# Patient Record
Sex: Male | Born: 1980
Health system: Southern US, Community
[De-identification: ages and names within clinical notes are randomized; demographics above are authoritative.]

## PROBLEM LIST (undated history)

## (undated) DIAGNOSIS — T7840XA Allergy, unspecified, initial encounter: Secondary | ICD-10-CM

## (undated) DIAGNOSIS — K219 Gastro-esophageal reflux disease without esophagitis: Secondary | ICD-10-CM

## (undated) HISTORY — DX: Gastro-esophageal reflux disease without esophagitis: K21.9

## (undated) HISTORY — PX: KNEE SURGERY: SHX244

## (undated) HISTORY — PX: TYMPANOSTOMY TUBE PLACEMENT: SHX32

## (undated) HISTORY — PX: TONSILLECTOMY: SUR1361

## (undated) HISTORY — DX: Allergy, unspecified, initial encounter: T78.40XA

---

## 2005-01-13 ENCOUNTER — Ambulatory Visit: Payer: Self-pay | Admitting: Family Medicine

## 2005-05-01 ENCOUNTER — Ambulatory Visit: Payer: Self-pay | Admitting: Family Medicine

## 2005-06-23 ENCOUNTER — Ambulatory Visit: Payer: Self-pay | Admitting: Family Medicine

## 2006-03-22 ENCOUNTER — Ambulatory Visit: Payer: Self-pay | Admitting: Family Medicine

## 2006-12-14 ENCOUNTER — Ambulatory Visit: Payer: Self-pay | Admitting: Family Medicine

## 2006-12-14 DIAGNOSIS — J309 Allergic rhinitis, unspecified: Secondary | ICD-10-CM | POA: Insufficient documentation

## 2006-12-14 DIAGNOSIS — S81809A Unspecified open wound, unspecified lower leg, initial encounter: Secondary | ICD-10-CM | POA: Insufficient documentation

## 2006-12-31 ENCOUNTER — Ambulatory Visit: Payer: Self-pay | Admitting: Family Medicine

## 2007-01-03 DIAGNOSIS — S8990XA Unspecified injury of unspecified lower leg, initial encounter: Secondary | ICD-10-CM | POA: Insufficient documentation

## 2007-01-03 DIAGNOSIS — S99929A Unspecified injury of unspecified foot, initial encounter: Secondary | ICD-10-CM

## 2007-01-03 DIAGNOSIS — S99919A Unspecified injury of unspecified ankle, initial encounter: Secondary | ICD-10-CM

## 2007-01-05 ENCOUNTER — Encounter: Payer: Self-pay | Admitting: Family Medicine

## 2008-04-11 ENCOUNTER — Ambulatory Visit: Payer: Self-pay | Admitting: Family Medicine

## 2008-04-11 DIAGNOSIS — J019 Acute sinusitis, unspecified: Secondary | ICD-10-CM | POA: Insufficient documentation

## 2008-04-16 ENCOUNTER — Telehealth: Payer: Self-pay | Admitting: Family Medicine

## 2008-06-04 ENCOUNTER — Ambulatory Visit: Payer: Self-pay | Admitting: Family Medicine

## 2008-06-04 DIAGNOSIS — L0211 Cutaneous abscess of neck: Secondary | ICD-10-CM | POA: Insufficient documentation

## 2008-06-04 DIAGNOSIS — L03221 Cellulitis of neck: Secondary | ICD-10-CM

## 2008-06-06 ENCOUNTER — Ambulatory Visit: Payer: Self-pay | Admitting: Family Medicine

## 2008-06-06 LAB — CONVERTED CEMR LAB
Bilirubin Urine: NEGATIVE
Glucose, Urine, Semiquant: NEGATIVE
Ketones, urine, test strip: NEGATIVE
Specific Gravity, Urine: 1.01
pH: 6

## 2008-06-08 LAB — CONVERTED CEMR LAB
ALT: 44 units/L (ref 0–53)
AST: 41 units/L — ABNORMAL HIGH (ref 0–37)
Albumin: 4.4 g/dL (ref 3.5–5.2)
Alkaline Phosphatase: 79 units/L (ref 39–117)
Basophils Absolute: 0 10*3/uL (ref 0.0–0.1)
Calcium: 9.3 mg/dL (ref 8.4–10.5)
Eosinophils Relative: 2.9 % (ref 0.0–5.0)
GFR calc non Af Amer: 143.56 mL/min (ref >60)
Glucose, Bld: 75 mg/dL (ref 70–99)
HCT: 47.1 % (ref 39.0–52.0)
Hemoglobin: 16.3 g/dL (ref 13.0–17.0)
Lymphocytes Relative: 24.9 % (ref 12.0–46.0)
Lymphs Abs: 2.4 10*3/uL (ref 0.7–4.0)
Monocytes Relative: 7.2 % (ref 3.0–12.0)
Neutro Abs: 6.3 10*3/uL (ref 1.4–7.7)
Potassium: 3.8 meq/L (ref 3.5–5.1)
RDW: 12.3 % (ref 11.5–14.6)
Sodium: 142 meq/L (ref 135–145)
Total Bilirubin: 1.1 mg/dL (ref 0.3–1.2)
WBC: 9.7 10*3/uL (ref 4.5–10.5)

## 2008-11-13 ENCOUNTER — Telehealth: Payer: Self-pay | Admitting: Family Medicine

## 2008-11-13 ENCOUNTER — Encounter: Payer: Self-pay | Admitting: Family Medicine

## 2009-05-03 ENCOUNTER — Ambulatory Visit: Payer: Self-pay | Admitting: Family Medicine

## 2009-05-03 DIAGNOSIS — F17200 Nicotine dependence, unspecified, uncomplicated: Secondary | ICD-10-CM | POA: Insufficient documentation

## 2009-07-15 ENCOUNTER — Telehealth: Payer: Self-pay | Admitting: Family Medicine

## 2010-01-17 ENCOUNTER — Encounter: Admission: RE | Admit: 2010-01-17 | Discharge: 2010-01-17 | Payer: Self-pay | Admitting: Occupational Medicine

## 2010-04-15 NOTE — Assessment & Plan Note (Signed)
Summary: sore throat/pain behind ear/chest congestion/cjr   Vital Signs:  Patient profile:   30 year old male Height:      75 inches Weight:      285 pounds BMI:     35.75 Temp:     98.1 degrees F oral BP sitting:   132 / 84  (left arm) Cuff size:   large  Vitals Entered By: Alfred Levins, CMA (May 03, 2009 4:09 PM) CC: neck pain goes to head (sharp pain) x 2 days   History of Present Illness: Here for 3 days of a ST and of fullness and pain in the right neck area. Some dry coughing. No fever. No HA. Using Motrin. Also, he asks for help to quit smoking.   Allergies (verified): No Known Drug Allergies  Past History:  Past Medical History: Reviewed history from 12/14/2006 and no changes required. Allergic rhinitis  Review of Systems  The patient denies anorexia, fever, weight loss, weight gain, vision loss, decreased hearing, hoarseness, chest pain, syncope, dyspnea on exertion, peripheral edema, headaches, hemoptysis, abdominal pain, melena, hematochezia, severe indigestion/heartburn, hematuria, incontinence, genital sores, muscle weakness, suspicious skin lesions, transient blindness, difficulty walking, depression, unusual weight change, abnormal bleeding, enlarged lymph nodes, angioedema, breast masses, and testicular masses.    Physical Exam  General:  Well-developed,well-nourished,in no acute distress; alert,appropriate and cooperative throughout examination Head:  Normocephalic and atraumatic without obvious abnormalities. No apparent alopecia or balding. Eyes:  No corneal or conjunctival inflammation noted. EOMI. Perrla. Funduscopic exam benign, without hemorrhages, exudates or papilledema. Vision grossly normal. Ears:  External ear exam shows no significant lesions or deformities.  Otoscopic examination reveals clear canals, tympanic membranes are intact bilaterally without bulging, retraction, inflammation or discharge. Hearing is grossly normal bilaterally. Nose:   External nasal examination shows no deformity or inflammation. Nasal mucosa are pink and moist without lesions or exudates. Mouth:  Oral mucosa and oropharynx without lesions or exudates.  Teeth in good repair. Neck:  No deformities or masses. Mildly tender along the right sternoclaidomastoid chain Lungs:  Normal respiratory effort, chest expands symmetrically. Lungs are clear to auscultation, no crackles or wheezes. Skin:  Intact without suspicious lesions or rashes Cervical Nodes:  No lymphadenopathy noted   Impression & Recommendations:  Problem # 1:  CELLULITIS AND ABSCESS OF NECK (ICD-682.1)  His updated medication list for this problem includes:    Augmentin 875-125 Mg Tabs (Amoxicillin-pot clavulanate) .Marland Kitchen..Marland Kitchen Two times a day  Problem # 2:  TOBACCO ABUSE (ICD-305.1)  His updated medication list for this problem includes:    Chantix Starting Month Pak 0.5 Mg X 11 & 1 Mg X 42 Tabs (Varenicline tartrate) .Marland Kitchen... As directed    Chantix Continuing Month Pak 1 Mg Tabs (Varenicline tartrate) .Marland Kitchen... As directed  Complete Medication List: 1)  Augmentin 875-125 Mg Tabs (Amoxicillin-pot clavulanate) .... Two times a day 2)  Chantix Starting Month Pak 0.5 Mg X 11 & 1 Mg X 42 Tabs (Varenicline tartrate) .... As directed 3)  Chantix Continuing Month Pak 1 Mg Tabs (Varenicline tartrate) .... As directed  Patient Instructions: 1)  Get on Augmentin and follow up as needed. Use Motrin as needed , Try Chantix Prescriptions: CHANTIX CONTINUING MONTH PAK 1 MG TABS (VARENICLINE TARTRATE) as directed  #1 x 1   Entered and Authorized by:   Nelwyn Salisbury MD   Signed by:   Nelwyn Salisbury MD on 05/03/2009   Method used:   Print then Give to Patient   RxID:  [id number] CHANTIX STARTING MONTH PAK 0.5 MG X 11 & 1 MG X 42 TABS (VARENICLINE TARTRATE) as directed  #1 x 0   Entered and Authorized by:   Nelwyn Salisbury MD   Signed by:   Nelwyn Salisbury MD on 05/03/2009   Method used:   Print then Give to  Patient   RxID:   [id number] AUGMENTIN 875-125 MG TABS (AMOXICILLIN-POT CLAVULANATE) two times a day  #20 x 0   Entered and Authorized by:   Nelwyn Salisbury MD   Signed by:   Nelwyn Salisbury MD on 05/03/2009   Method used:   Electronically to        [facility]  [address] [id number] ([facility]       [address]       [place], [place]  [address]       Ph: [phone] or [phone]       Fax: [phone]   RxID:   [id number]

## 2010-04-15 NOTE — Progress Notes (Signed)
Summary: pt lost written script for Chantix. Pls call in to [facility]   Phone Note Call from Patient Call back at [phone] cell   Caller: Patient Summary of Call: Pt lost written script for Chantix. Please call in to [facility] on [address]   Initial call taken by: Lucy Antigua,  Jul 15, 2009 11:02 AM  Follow-up for Phone Call        Rx Called In Follow-up by: Raechel Ache, RN,  Jul 15, 2009 11:05 AM    Prescriptions: CHANTIX CONTINUING MONTH PAK 1 MG TABS (VARENICLINE TARTRATE) as directed  #1 x 1   Entered by:   Raechel Ache, RN   Authorized by:   Nelwyn Salisbury MD   Signed by:   Raechel Ache, RN on 07/15/2009   Method used:   [facility]ically to        [facility]  [address] [id number]       [address]       [place], [place]  [address]       Ph: [phone] or [phone]       Fax: [phone]   RxID:   [id number] CHANTIX STARTING MONTH PAK 0.5 MG X 11 & 1 MG X 42 TABS (VARENICLINE TARTRATE) as directed  #1 x 0   Entered by:   Raechel Ache, RN   Authorized by:   Nelwyn Salisbury MD   Signed by:   Raechel Ache, RN on 07/15/2009   Method used:   [facility]        [facility]  [address] [id number]       [address]       [place], [place]  [address]       Ph: [phone] or [phone]       Fax: [phone]   RxID:   [id number]

## 2010-06-25 ENCOUNTER — Encounter: Payer: Self-pay | Admitting: Family Medicine

## 2010-06-30 ENCOUNTER — Other Ambulatory Visit: Payer: Self-pay

## 2010-07-07 ENCOUNTER — Encounter: Payer: Self-pay | Admitting: Family Medicine

## 2010-07-07 DIAGNOSIS — Z0289 Encounter for other administrative examinations: Secondary | ICD-10-CM

## 2010-11-07 ENCOUNTER — Ambulatory Visit (INDEPENDENT_AMBULATORY_CARE_PROVIDER_SITE_OTHER): Payer: 59 | Admitting: Family Medicine

## 2010-11-07 ENCOUNTER — Encounter: Payer: Self-pay | Admitting: Family Medicine

## 2010-11-07 VITALS — BP 140/90 | HR 90 | Temp 98.4°F | Wt 308.0 lb

## 2010-11-07 DIAGNOSIS — R635 Abnormal weight gain: Secondary | ICD-10-CM

## 2010-11-07 DIAGNOSIS — J329 Chronic sinusitis, unspecified: Secondary | ICD-10-CM

## 2010-11-07 LAB — CBC WITH DIFFERENTIAL/PLATELET
Eosinophils Relative: 3 % (ref 0.0–5.0)
Lymphocytes Relative: 17.8 % (ref 12.0–46.0)
Monocytes Absolute: 1.4 10*3/uL — ABNORMAL HIGH (ref 0.1–1.0)
Monocytes Relative: 12.8 % — ABNORMAL HIGH (ref 3.0–12.0)
Neutrophils Relative %: 66 % (ref 43.0–77.0)
Platelets: 204 10*3/uL (ref 150.0–400.0)
RBC: 5.58 Mil/uL (ref 4.22–5.81)
WBC: 10.6 10*3/uL — ABNORMAL HIGH (ref 4.5–10.5)

## 2010-11-07 LAB — HEPATIC FUNCTION PANEL
ALT: 58 U/L — ABNORMAL HIGH (ref 0–53)
Total Protein: 7.5 g/dL (ref 6.0–8.3)

## 2010-11-07 LAB — BASIC METABOLIC PANEL
BUN: 16 mg/dL (ref 6–23)
CO2: 29 mEq/L (ref 19–32)
Calcium: 9.1 mg/dL (ref 8.4–10.5)
Chloride: 102 mEq/L (ref 96–112)
Creatinine, Ser: 1 mg/dL (ref 0.4–1.5)

## 2010-11-07 LAB — TSH: TSH: 0.44 u[IU]/mL (ref 0.35–5.50)

## 2010-11-07 MED ORDER — AZITHROMYCIN 250 MG PO TABS: ORAL_TABLET | ORAL | Status: AC

## 2010-11-07 NOTE — Progress Notes (Signed)
  Subjective:    Patient ID: Christopher Fox, male    DOB: 17-Aug-1980, 30 y.o.   MRN: [id number]  HPI Here for 5 days of sinus pressure, PND, ST, and a dry cough. No fever. Also he has put on about 25 lbs of weight in the past year, and he feels tired all the time with no stamina. He admits to eating a poor diet.    Review of Systems  Constitutional: Positive for unexpected weight change.  HENT: Positive for congestion, postnasal drip and sinus pressure.   Eyes: Negative.   Respiratory: Positive for cough.        Objective:   Physical Exam  Constitutional: He appears well-developed and well-nourished.  HENT:  Right Ear: External ear normal.  Left Ear: External ear normal.  Nose: Nose normal.  Mouth/Throat: Oropharynx is clear and moist. No oropharyngeal exudate.  Eyes: Conjunctivae are normal. Pupils are equal, round, and reactive to light.  Neck: No thyromegaly present.  Pulmonary/Chest: Effort normal and breath sounds normal. No respiratory distress. He has no wheezes. He has no rales. He exhibits no tenderness.  Lymphadenopathy:    He has no cervical adenopathy.          Assessment & Plan:  Treat with a Zpack. Get labs to look at the weight gain. We discussed his need to eat a better diet

## 2010-11-12 ENCOUNTER — Telehealth: Payer: Self-pay | Admitting: Family Medicine

## 2010-11-12 NOTE — Telephone Encounter (Signed)
Left voice message with results.

## 2010-11-12 NOTE — Telephone Encounter (Signed)
Message copied by Baldemar Friday on Wed Nov 12, 2010 10:17 AM ------      Message from: Gershon Crane A      Created: Mon Nov 10, 2010 10:29 AM       Normal
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
## 2012-09-20 ENCOUNTER — Ambulatory Visit (INDEPENDENT_AMBULATORY_CARE_PROVIDER_SITE_OTHER): Payer: 59 | Admitting: Family Medicine

## 2012-09-20 ENCOUNTER — Encounter: Payer: Self-pay | Admitting: Family Medicine

## 2012-09-20 VITALS — BP 138/90 | HR 87 | Temp 98.0°F | Wt 304.0 lb

## 2012-09-20 DIAGNOSIS — K219 Gastro-esophageal reflux disease without esophagitis: Secondary | ICD-10-CM

## 2012-09-20 MED ORDER — OMEPRAZOLE 40 MG PO CPDR: 40.0000 mg | DELAYED_RELEASE_CAPSULE | Freq: Every day | ORAL | Status: DC

## 2012-09-20 NOTE — Progress Notes (Signed)
  Subjective:    Patient ID: Christopher Fox, male    DOB: August 16, 1980, 32 y.o.   MRN: [id number]  HPI Here for intermittent heartburn, chest pressure, and upper abdominal pains for the past 2 months. No SOB or nausea. OTC Zantac helps a little. This is not related to exertion.    Review of Systems  Constitutional: Negative.   Respiratory: Positive for chest tightness. Negative for cough, shortness of breath and wheezing.   Cardiovascular: Positive for chest pain. Negative for palpitations and leg swelling.  Gastrointestinal: Positive for abdominal pain. Negative for nausea, vomiting, diarrhea, constipation, blood in stool and abdominal distention.       Objective:   Physical Exam  Constitutional: He appears well-developed and well-nourished. No distress.  Neck: No thyromegaly present.  Cardiovascular: Normal rate, regular rhythm, normal heart sounds and intact distal pulses.   Pulmonary/Chest: Effort normal and breath sounds normal.  Abdominal: Soft. Bowel sounds are normal. He exhibits no distension and no mass. There is no tenderness. There is no rebound and no guarding.  Lymphadenopathy:    He has no cervical adenopathy.          Assessment & Plan:  Try Omeprazole. We will follow up at his cpx next month

## 2012-10-05 ENCOUNTER — Other Ambulatory Visit (INDEPENDENT_AMBULATORY_CARE_PROVIDER_SITE_OTHER): Payer: 59

## 2012-10-05 DIAGNOSIS — Z0000 Encounter for general adult medical examination without abnormal findings: Secondary | ICD-10-CM

## 2012-10-05 LAB — BASIC METABOLIC PANEL
CO2: 26 mEq/L (ref 19–32)
Chloride: 104 mEq/L (ref 96–112)
GFR: 119.42 mL/min (ref >60.00)
Glucose, Bld: 89 mg/dL (ref 70–99)
Potassium: 4.2 mEq/L (ref 3.5–5.1)
Sodium: 140 mEq/L (ref 135–145)

## 2012-10-05 LAB — TSH: TSH: 1.08 u[IU]/mL (ref 0.35–5.50)

## 2012-10-05 LAB — CBC WITH DIFFERENTIAL/PLATELET
Basophils Absolute: 0 10*3/uL (ref 0.0–0.1)
HCT: 46.3 % (ref 39.0–52.0)
Hemoglobin: 15.9 g/dL (ref 13.0–17.0)
Lymphs Abs: 2.6 10*3/uL (ref 0.7–4.0)
MCV: 82.2 fl (ref 78.0–100.0)
Monocytes Relative: 5.8 % (ref 3.0–12.0)
Neutro Abs: 8.7 10*3/uL — ABNORMAL HIGH (ref 1.4–7.7)
RDW: 12.7 % (ref 11.5–14.6)

## 2012-10-05 LAB — LIPID PANEL
LDL Cholesterol: 125 mg/dL — ABNORMAL HIGH (ref 0–99)
VLDL: 22.8 mg/dL (ref 0.0–40.0)

## 2012-10-05 LAB — POCT URINALYSIS DIPSTICK
Ketones, UA: NEGATIVE
Leukocytes, UA: NEGATIVE
Protein, UA: NEGATIVE
Urobilinogen, UA: 0.2

## 2012-10-05 LAB — HEPATIC FUNCTION PANEL: Albumin: 4.4 g/dL (ref 3.5–5.2)

## 2012-10-07 NOTE — Progress Notes (Signed)
Quick Note:  Pt has appointment on 10/19/12 will go over then. ______

## 2012-10-12 ENCOUNTER — Encounter: Payer: 59 | Admitting: Family Medicine

## 2012-10-19 ENCOUNTER — Encounter: Payer: Self-pay | Admitting: Family Medicine

## 2012-10-19 ENCOUNTER — Ambulatory Visit (INDEPENDENT_AMBULATORY_CARE_PROVIDER_SITE_OTHER): Payer: 59 | Admitting: Family Medicine

## 2012-10-19 VITALS — BP 138/80 | HR 106 | Temp 97.9°F | Ht 75.0 in | Wt 301.0 lb

## 2012-10-19 DIAGNOSIS — Z0000 Encounter for general adult medical examination without abnormal findings: Secondary | ICD-10-CM

## 2012-10-19 NOTE — Progress Notes (Signed)
  Subjective:    Patient ID: Christopher Fox, male    DOB: April 12, 1980, 32 y.o.   MRN: [id number]  HPI 32 yr old male for a cpx. He feels fine and has no concerns. He has been on Prilosec for a month, and his GERD has totally resolved.    Review of Systems  Constitutional: Negative.   HENT: Negative.   Eyes: Negative.   Respiratory: Negative.   Cardiovascular: Negative.   Gastrointestinal: Negative.   Genitourinary: Negative.   Musculoskeletal: Negative.   Skin: Negative.   Neurological: Negative.   Psychiatric/Behavioral: Negative.        Objective:   Physical Exam  Constitutional: He is oriented to person, place, and time. He appears well-developed and well-nourished. No distress.  HENT:  Head: Normocephalic and atraumatic.  Right Ear: External ear normal.  Left Ear: External ear normal.  Nose: Nose normal.  Mouth/Throat: Oropharynx is clear and moist. No oropharyngeal exudate.  Eyes: Conjunctivae and EOM are normal. Pupils are equal, round, and reactive to light. Right eye exhibits no discharge. Left eye exhibits no discharge. No scleral icterus.  Neck: Neck supple. No JVD present. No tracheal deviation present. No thyromegaly present.  Cardiovascular: Normal rate, regular rhythm, normal heart sounds and intact distal pulses.  Exam reveals no gallop and no friction rub.   No murmur heard. Pulmonary/Chest: Effort normal and breath sounds normal. No respiratory distress. He has no wheezes. He has no rales. He exhibits no tenderness.  Abdominal: Soft. Bowel sounds are normal. He exhibits no distension and no mass. There is no tenderness. There is no rebound and no guarding.  Genitourinary: Rectum normal, prostate normal and penis normal. Guaiac negative stool. No penile tenderness.  Musculoskeletal: Normal range of motion. He exhibits no edema and no tenderness.  Lymphadenopathy:    He has no cervical adenopathy.  Neurological: He is alert and oriented to person, place, and  time. He has normal reflexes. No cranial nerve deficit. He exhibits normal muscle tone. Coordination normal.  Skin: Skin is warm and dry. No rash noted. He is not diaphoretic. No erythema. No pallor.  Psychiatric: He has a normal mood and affect. His behavior is normal. Judgment and thought content normal.          Assessment & Plan:  Well exam. Encouraged him to lose some weight.

## 2012-12-12 ENCOUNTER — Emergency Department (HOSPITAL_BASED_OUTPATIENT_CLINIC_OR_DEPARTMENT_OTHER)
Admission: EM | Admit: 2012-12-12 | Discharge: 2012-12-12 | Disposition: A | Discharge status: Home / Self Care | Payer: 59 | Attending: Emergency Medicine | Admitting: Emergency Medicine

## 2012-12-12 ENCOUNTER — Encounter (HOSPITAL_BASED_OUTPATIENT_CLINIC_OR_DEPARTMENT_OTHER): Payer: Self-pay | Admitting: *Deleted

## 2012-12-12 ENCOUNTER — Emergency Department (HOSPITAL_BASED_OUTPATIENT_CLINIC_OR_DEPARTMENT_OTHER): Payer: 59

## 2012-12-12 DIAGNOSIS — S022XXA Fracture of nasal bones, initial encounter for closed fracture: Secondary | ICD-10-CM | POA: Insufficient documentation

## 2012-12-12 DIAGNOSIS — IMO0002 Reserved for concepts with insufficient information to code with codable children: Secondary | ICD-10-CM | POA: Insufficient documentation

## 2012-12-12 DIAGNOSIS — K219 Gastro-esophageal reflux disease without esophagitis: Secondary | ICD-10-CM | POA: Insufficient documentation

## 2012-12-12 DIAGNOSIS — S022XXB Fracture of nasal bones, initial encounter for open fracture: Secondary | ICD-10-CM

## 2012-12-12 DIAGNOSIS — Y939 Activity, unspecified: Secondary | ICD-10-CM | POA: Insufficient documentation

## 2012-12-12 DIAGNOSIS — F17200 Nicotine dependence, unspecified, uncomplicated: Secondary | ICD-10-CM | POA: Insufficient documentation

## 2012-12-12 DIAGNOSIS — Z79899 Other long term (current) drug therapy: Secondary | ICD-10-CM | POA: Insufficient documentation

## 2012-12-12 DIAGNOSIS — Y929 Unspecified place or not applicable: Secondary | ICD-10-CM | POA: Insufficient documentation
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
## 2012-12-13 NOTE — ED Provider Notes (Signed)
Medical screening examination/treatment/procedure(s) were performed by non-physician practitioner and as supervising physician I was immediately available for consultation/collaboration.  Elva Breaker T Taitum Alms, MD 12/13/12 1518 

## 2013-02-23 ENCOUNTER — Encounter (HOSPITAL_COMMUNITY): Payer: Self-pay | Admitting: Emergency Medicine

## 2013-02-23 ENCOUNTER — Emergency Department (HOSPITAL_COMMUNITY)
Admission: EM | Admit: 2013-02-23 | Discharge: 2013-02-23 | Disposition: A | Discharge status: Home / Self Care | Payer: 59 | Source: Home / Self Care

## 2013-02-23 DIAGNOSIS — R197 Diarrhea, unspecified: Secondary | ICD-10-CM

## 2013-02-23 DIAGNOSIS — R531 Weakness: Secondary | ICD-10-CM

## 2013-02-23 DIAGNOSIS — Z23 Encounter for immunization: Secondary | ICD-10-CM

## 2013-02-23 DIAGNOSIS — F410 Panic disorder [episodic paroxysmal anxiety] without agoraphobia: Secondary | ICD-10-CM

## 2013-02-23 DIAGNOSIS — R5381 Other malaise: Secondary | ICD-10-CM

## 2013-02-23 MED ORDER — INFLUENZA VAC SPLIT QUAD 0.5 ML IM SUSP
INTRAMUSCULAR | Status: AC
  Filled 2013-02-23: qty 0.5

## 2013-02-23 MED ORDER — INFLUENZA VAC SPLIT QUAD 0.5 ML IM SUSP
0.5000 mL | INTRAMUSCULAR | Status: AC
  Administered 2013-02-23: 0.5 mL via INTRAMUSCULAR

## 2013-02-23 NOTE — ED Notes (Signed)
Patient reports "stomach thing" for the last 24 hours.  Patient reports diarrhea yesterday, no diarrhea today.  Reports several people at work with the same.  Patient reports while working this am, felt lightheaded and dizzy, "like passing out".  Patient volunteers he is under a lot of stress.  Has a 21 month old and 31 year old and concerned of them getting sick.

## 2013-02-23 NOTE — ED Provider Notes (Signed)
CSN: [id number]     Arrival date & time 02/23/13  4098 History   First MD Initiated Contact with Patient 02/23/13 1011     Chief Complaint  Patient presents with  . Dizziness   (Consider location/radiation/quality/duration/timing/severity/associated sxs/prior Treatment) HPI Comments: 32 year old male states that around 9:00 this morning he was at work and had a sudden onset of weakness and dizziness associated with a "wheezy feeling" in his stomach. He actually denied nausea or vomiting. He went back to his truck and sat in the truck and symptoms abated approximately 10-15 minutes after onset. He also notes that he said 4-5 diarrhea stools for the past 24 hours. This is similar to his chronic diarrhea stools but more than usual. Denies chest pain or shortness of breath. Denied syncope. At this time he is asymptomatic and states all symptoms experienced earlier have abated.  Never started the Chantix.   Past Medical History  Diagnosis Date  . Allergy   . GERD (gastroesophageal reflux disease)    Past Surgical History  Procedure Laterality Date  . Tonsillectomy    . Tympanostomy tube placement     Family History  Problem Relation Age of Onset  . Hypertension    . Sudden death    . Stroke    . Asthma     History  Substance Use Topics  . Smoking status: Current Every Day Smoker -- 1.00 packs/day for 10 years    Types: Cigarettes  . Smokeless tobacco: Never Used     Comment: trying to quit  . Alcohol Use: 0.6 oz/week    1 Cans of beer per week     Comment: occ    Review of Systems  Constitutional: Positive for activity change. Negative for fever.  HENT: Negative.   Eyes: Negative for visual disturbance.  Respiratory: Negative for cough, choking and shortness of breath.   Cardiovascular: Negative for chest pain and palpitations.  Gastrointestinal: Positive for diarrhea.  Genitourinary: Negative.   Musculoskeletal: Negative.   Skin: Negative.   Neurological: Positive for  dizziness. Negative for tremors, syncope, speech difficulty and headaches.  Psychiatric/Behavioral: The patient is nervous/anxious.     Allergies  Review of patient's allergies indicates no known allergies.  Home Medications   Current Outpatient Rx  Name  Route  Sig  Dispense  Refill  . omeprazole (PRILOSEC) 40 MG capsule   Oral   Take 1 capsule (40 mg total) by mouth daily.   30 capsule   11    BP 121/70  Pulse 90  Temp(Src) 98.3 F (36.8 C) (Oral)  Resp 16  SpO2 97% Physical Exam  Nursing note and vitals reviewed. Constitutional: He is oriented to person, place, and time. He appears well-developed and well-nourished. No distress.  HENT:  Head: Normocephalic and atraumatic.  Mouth/Throat: Oropharynx is clear and moist. No oropharyngeal exudate.  Eyes: Conjunctivae and EOM are normal.  Neck: Normal range of motion. Neck supple.  Cardiovascular: Normal rate, regular rhythm, normal heart sounds and intact distal pulses.   No carotid bruits  Pulmonary/Chest: Effort normal. No respiratory distress.  Diminished BS bilat, rare expiratory wheeze.   Musculoskeletal: Normal range of motion. He exhibits no edema and no tenderness.  Lymphadenopathy:    He has no cervical adenopathy.  Neurological: He is alert and oriented to person, place, and time. No cranial nerve deficit.  Skin: Skin is warm and dry. No rash noted.  Psychiatric: He has a normal mood and affect.    ED Course  Procedures (including critical care time) Labs Review Labs Reviewed - No data to display Imaging Review No results found. EKG: NSR, No ectopy. No S-T T changes   MDM   1. Anxiety attack   2. Weakness generalized   3. Diarrhea      Nl exam in office. EKG WNL. Diff may included viral syndrome, IBS, anxiety related symptoms. Off work today, rest and drink plenty of fluids Cal your PCP for f/u to discuss poss IBS, and spirometry testing.    Hayden Rasmussen, NP 02/23/13 1144  Hayden Rasmussen,  NP 02/23/13 1145

## 2013-02-23 NOTE — ED Notes (Signed)
Lengthy discussion at triage ... Patient anxious, no paticular reason shared with this nurse

## 2013-02-23 NOTE — ED Provider Notes (Signed)
Medical screening examination/treatment/procedure(s) were performed by non-physician practitioner and as supervising physician I was immediately available for consultation/collaboration.  Leslee Home, M.D.   Reuben Likes, MD 02/23/13 1325

## 2013-10-09 ENCOUNTER — Ambulatory Visit (INDEPENDENT_AMBULATORY_CARE_PROVIDER_SITE_OTHER): Payer: 59 | Admitting: Family Medicine

## 2013-10-09 ENCOUNTER — Encounter: Payer: Self-pay | Admitting: Family Medicine

## 2013-10-09 VITALS — BP 121/66 | HR 66 | Temp 98.5°F | Ht 75.0 in | Wt 250.0 lb

## 2013-10-09 DIAGNOSIS — H6121 Impacted cerumen, right ear: Secondary | ICD-10-CM

## 2013-10-09 DIAGNOSIS — K219 Gastro-esophageal reflux disease without esophagitis: Secondary | ICD-10-CM

## 2013-10-09 DIAGNOSIS — H6120 Impacted cerumen, unspecified ear: Secondary | ICD-10-CM

## 2013-10-09 MED ORDER — OMEPRAZOLE 40 MG PO CPDR: 40.0000 mg | DELAYED_RELEASE_CAPSULE | Freq: Every day | ORAL | Status: DC

## 2013-10-09 NOTE — Progress Notes (Signed)
Pre visit review using our clinic review tool, if applicable. No additional management support is needed unless otherwise documented below in the visit note. 

## 2013-10-09 NOTE — Progress Notes (Signed)
   Subjective:    Patient ID: Caralyn GuileSamuel H Fluegge, male    DOB: 11/02/1980, 33 y.o.   MRN: [id number]  HPI Here for 4 weeks of pressure and decreased hearing in the right ear. No pain or sinus sx. Also his GERD has been well controlled and he needs refills of Nexium.    Review of Systems  Constitutional: Negative.   HENT: Positive for hearing loss. Negative for congestion, ear discharge, ear pain, nosebleeds, postnasal drip and sinus pressure.   Gastrointestinal: Negative.        Objective:   Physical Exam  Constitutional: He appears well-developed and well-nourished.  HENT:  Left Ear: External ear normal.  Nose: Nose normal.  Mouth/Throat: Oropharynx is clear and moist. No oropharyngeal exudate.  Right ear canal is full of cerumen. After this was removed, on re-exam the canal was clear and the TM is clear  Eyes: Conjunctivae are normal.          Assessment & Plan:  We removed all cerumen from the right ear canal. He felt better and could hear normally after that. His GERD is stable

## 2014-05-04 ENCOUNTER — Ambulatory Visit (INDEPENDENT_AMBULATORY_CARE_PROVIDER_SITE_OTHER): Payer: 59 | Admitting: Internal Medicine

## 2014-05-04 ENCOUNTER — Encounter: Payer: Self-pay | Admitting: Internal Medicine

## 2014-05-04 VITALS — BP 116/78 | Temp 98.2°F | Wt 273.1 lb

## 2014-05-04 DIAGNOSIS — A09 Infectious gastroenteritis and colitis, unspecified: Secondary | ICD-10-CM

## 2014-05-04 NOTE — Progress Notes (Signed)
Pre visit review using our clinic review tool, if applicable. No additional management support is needed unless otherwise documented below in the visit note.  Chief Complaint  Patient presents with  . Diarrhea    Started Wednesday morning around midnight    HPI: Patient Christopher Fox  comes in today for SDA for  new problem evaluation.onset about 48 hours+ with fever maialse and frequent watery diarrhea  No vomiting . A bit better today but weekend and lasting longer than other bugs he may have had.  Daughter got stomach bug   4    V and d better.  And Tuesday had 102 fever  And nauseous.  Then diarrhea fever gone since then . Heavy  .  Low energy  chicken noodle soup.    Drinking .  Not usually last this long 3rd day .   Tues  and probiotics  No blood . out every hours  Today some slowing  Every  4 x per day .  Watery onset  Dark and now clear.   Grilled cheese yesterday.  Doesn't feel ltthat bad  But feels worse getting later .  Fever subsided. The next day.  ROS: See pertinent positives and negatives per HPI. No recent  antibiotics   Well water.  No o1 year ok and wife not sick  Landscape. Worker .  No hxo f c diff antibiotic but children have been on antibiotic s  Past Medical History  Diagnosis Date  . Allergy   . GERD (gastroesophageal reflux disease)     Family History  Problem Relation Age of Onset  . Hypertension    . Sudden death    . Stroke    . Asthma      History   Social History  . Marital Status: Married    Spouse Name: N/A  . Number of Children: N/A  . Years of Education: N/A   Social History Main Topics  . Smoking status: Former Smoker -- 1.00 packs/day for 10 years    Types: Cigarettes    Quit date: 04/16/2013  . Smokeless tobacco: Current User    Types: Snuff  . Alcohol Use: 0.6 oz/week    1 Cans of beer per week     Comment: occ  . Drug Use: No  . Sexual Activity: Not on file   Other Topics Concern  . None   Social History Narrative     Outpatient Encounter Prescriptions as of 05/04/2014  Medication Sig  . omeprazole (PRILOSEC) 40 MG capsule Take 1 capsule (40 mg total) by mouth daily. (Patient not taking: Reported on 05/04/2014)    EXAM:  BP 116/78 mmHg  Temp(Src) 98.2 F (36.8 C) (Oral)  Wt 273 lb 1.6 oz (123.877 kg)  Body mass index is 34.14 kg/(m^2).  GENERAL: vitals reviewed and listed above, alert, oriented, appears well hydrated and in no acute distress non t oxic tired a bit  HEENT: atraumatic, conjunctiva  clear, no obvious abnormalities on inspection of external nose and ears OP : no lesion edema or exudate  Moist membranes  NECK: no obvious masses on inspection palpation  LUNGS: clear to auscultation bilaterally, no wheezes, rales or rhonchi, good air movement CV: HRRR, no clubbing cyanosis or  peripheral edema nl cap refill  Abdomen:  Sof,t normal bowel sounds without hepatosplenomegaly, no guarding rebound or masses no CVA tenderness. Skin: normal capillary refill ,turgor , color: No acute rashes ,petechiae or bruising MS: moves all extremities without noticeable focal  abnormality PSYCH: pleasant and cooperative, no obvious depression or anxiety  ASSESSMENT AND PLAN:  Discussed the following assessment and plan:  Diarrhea, infectious, adult - no alarm sx todayexpectant managment at this time   3 days into illness disc sx relief  fluids   options of getting stool tests but getting some better  No fever or blood  symp rx and if  persistent or progressive consider stool tests etc.  -Patient advised to return or notify health care team  if symptoms worsen ,persist or new concerns arise. ( there is noro in the community but he didn't have vomiting. ) Patient Instructions  This is acute diarrheal illness caused bu infection. Most of these get better on its own with time . Avoid juices and  caffiene and alcohol.  If not improving  after the week end contact us and we may get stool test to further  information.  Contact us if fever or blood in stool or severe pain.     Viral Gastroenteritis Viral gastroenteritis is also known as stomach flu. This condition affects the stomach and intestinal tract. It can cause sudden diarrhea and vomiting. The illness typically lasts 3 to 8 days. Most people develop an immune response that eventually gets rid of the virus. While this natural response develops, the virus can make you quite ill. CAUSES  Many different viruses can cause gastroenteritis, such as rotavirus or noroviruses. You can catch one of these viruses by consuming contaminated food or water. You may also catch a virus by sharing utensils or other personal items with an infected person or by touching a contaminated surface. SYMPTOMS  The most common symptoms are diarrhea and vomiting. These problems can cause a severe loss of body fluids (dehydration) and a body salt (electrolyte) imbalance. Other symptoms may include:  Fever.  Headache.  Fatigue.  Abdominal pain. DIAGNOSIS  Your caregiver can usually diagnose viral gastroenteritis based on your symptoms and a physical exam. A stool sample may also be taken to test for the presence of viruses or other infections. TREATMENT  This illness typically goes away on its own. Treatments are aimed at rehydration. The most serious cases of viral gastroenteritis involve vomiting so severely that you are not able to keep fluids down. In these cases, fluids must be given through an intravenous line (IV). HOME CARE INSTRUCTIONS   Drink enough fluids to keep your urine clear or pale yellow. Drink small amounts of fluids frequently and increase the amounts as tolerated.  Ask your caregiver for specific rehydration instructions.  Avoid:  Foods high in sugar.  Alcohol.  Carbonated drinks.  Tobacco.  Juice.  Caffeine drinks.  Extremely hot or cold fluids.  Fatty, greasy foods.  Too much intake of anything at one time.  Dairy  products until 24 to 48 hours after diarrhea stops.  You may consume probiotics. Probiotics are active cultures of beneficial bacteria. They may lessen the amount and number of diarrheal stools in adults. Probiotics can be found in yogurt with active cultures and in supplements.  Wash your hands well to avoid spreading the virus.  Only take over-the-counter or prescription medicines for pain, discomfort, or fever as directed by your caregiver. Do not give aspirin to children. Antidiarrheal medicines are not recommended.  Ask your caregiver if you should continue to take your regular prescribed and over-the-counter medicines.  Keep all follow-up appointments as directed by your caregiver. SEEK IMMEDIATE MEDICAL CARE IF:   You are unable to keep fluids down.  You do not urinate at least once every 6 to 8 hours.  You develop shortness of breath.  You notice blood in your stool or vomit. This may look like coffee grounds.  You have abdominal pain that increases or is concentrated in one small area (localized).  You have persistent vomiting or diarrhea.  You have a fever.  The patient is a child younger than 3 months, and he or she has a fever.  The patient is a child older than 3 months, and he or she has a fever and persistent symptoms.  The patient is a child older than 3 months, and he or she has a fever and symptoms suddenly get worse.  The patient is a baby, and he or she has no tears when crying. MAKE SURE YOU:   Understand these instructions.  Will watch your condition.  Will get help right away if you are not doing well or get worse. Document Released: 03/02/2005 Document Revised: 05/25/2011 Document Reviewed: 12/17/2010 [facility] Patient Information 2015 [place], [place]. This information is not intended to replace advice given to you by your health care provider. Make sure you discuss any questions you have with your health care provider.      Neta MendsWanda K. Panosh  M.D.

## 2014-05-04 NOTE — Patient Instructions (Signed)
This is acute diarrheal illness caused bu infection. Most of these get better on its own with time . Avoid juices and  caffiene and alcohol.  If not improving  after the week end contact us and we may get stool test to further information.  Contact us if fever or blood in stool or severe pain.     Viral Gastroenteritis Viral gastroenteritis is also known as stomach flu. This condition affects the stomach and intestinal tract. It can cause sudden diarrhea and vomiting. The illness typically lasts 3 to 8 days. Most people develop an immune response that eventually gets rid of the virus. While this natural response develops, the virus can make you quite ill. CAUSES  Many different viruses can cause gastroenteritis, such as rotavirus or noroviruses. You can catch one of these viruses by consuming contaminated food or water. You may also catch a virus by sharing utensils or other personal items with an infected person or by touching a contaminated surface. SYMPTOMS  The most common symptoms are diarrhea and vomiting. These problems can cause a severe loss of body fluids (dehydration) and a body salt (electrolyte) imbalance. Other symptoms may include:  Fever.  Headache.  Fatigue.  Abdominal pain. DIAGNOSIS  Your caregiver can usually diagnose viral gastroenteritis based on your symptoms and a physical exam. A stool sample may also be taken to test for the presence of viruses or other infections. TREATMENT  This illness typically goes away on its own. Treatments are aimed at rehydration. The most serious cases of viral gastroenteritis involve vomiting so severely that you are not able to keep fluids down. In these cases, fluids must be given through an intravenous line (IV). HOME CARE INSTRUCTIONS   Drink enough fluids to keep your urine clear or pale yellow. Drink small amounts of fluids frequently and increase the amounts as tolerated.  Ask your caregiver for specific rehydration  instructions.  Avoid:  Foods high in sugar.  Alcohol.  Carbonated drinks.  Tobacco.  Juice.  Caffeine drinks.  Extremely hot or cold fluids.  Fatty, greasy foods.  Too much intake of anything at one time.  Dairy products until 24 to 48 hours after diarrhea stops.  You may consume probiotics. Probiotics are active cultures of beneficial bacteria. They may lessen the amount and number of diarrheal stools in adults. Probiotics can be found in yogurt with active cultures and in supplements.  Wash your hands well to avoid spreading the virus.  Only take over-the-counter or prescription medicines for pain, discomfort, or fever as directed by your caregiver. Do not give aspirin to children. Antidiarrheal medicines are not recommended.  Ask your caregiver if you should continue to take your regular prescribed and over-the-counter medicines.  Keep all follow-up appointments as directed by your caregiver. SEEK IMMEDIATE MEDICAL CARE IF:   You are unable to keep fluids down.  You do not urinate at least once every 6 to 8 hours.  You develop shortness of breath.  You notice blood in your stool or vomit. This may look like coffee grounds.  You have abdominal pain that increases or is concentrated in one small area (localized).  You have persistent vomiting or diarrhea.  You have a fever.  The patient is a child younger than 3 months, and he or she has a fever.  The patient is a child older than 3 months, and he or she has a fever and persistent symptoms.  The patient is a child older than 3 months, and he  or she has a fever and symptoms suddenly get worse.  The patient is a baby, and he or she has no tears when crying. MAKE SURE YOU:   Understand these instructions.  Will watch your condition.  Will get help right away if you are not doing well or get worse. Document Released: 03/02/2005 Document Revised: 05/25/2011 Document Reviewed: 12/17/2010 [facility] Patient  Information 2015 [place], [place]. This information is not intended to replace advice given to you by your health care provider. Make sure you discuss any questions you have with your health care provider.

## 2014-10-11 ENCOUNTER — Other Ambulatory Visit: Payer: Self-pay | Admitting: Family Medicine

## 2015-02-12 ENCOUNTER — Emergency Department (HOSPITAL_COMMUNITY)
Admission: EM | Admit: 2015-02-12 | Discharge: 2015-02-13 | Disposition: A | Discharge status: Home / Self Care | Payer: Commercial Managed Care - HMO | Attending: Emergency Medicine | Admitting: Emergency Medicine

## 2015-02-12 ENCOUNTER — Encounter (HOSPITAL_COMMUNITY): Payer: Self-pay | Admitting: Emergency Medicine

## 2015-02-12 ENCOUNTER — Other Ambulatory Visit: Payer: Self-pay

## 2015-02-12 DIAGNOSIS — M549 Dorsalgia, unspecified: Secondary | ICD-10-CM | POA: Insufficient documentation

## 2015-02-12 DIAGNOSIS — Z79899 Other long term (current) drug therapy: Secondary | ICD-10-CM | POA: Diagnosis not present

## 2015-02-12 DIAGNOSIS — R1011 Right upper quadrant pain: Secondary | ICD-10-CM | POA: Diagnosis present

## 2015-02-12 DIAGNOSIS — K8050 Calculus of bile duct without cholangitis or cholecystitis without obstruction: Secondary | ICD-10-CM | POA: Insufficient documentation

## 2015-02-12 DIAGNOSIS — Z87891 Personal history of nicotine dependence: Secondary | ICD-10-CM | POA: Diagnosis not present

## 2015-02-12 DIAGNOSIS — K219 Gastro-esophageal reflux disease without esophagitis: Secondary | ICD-10-CM | POA: Insufficient documentation

## 2015-02-12 LAB — LIPASE, BLOOD: Lipase: 36 U/L (ref 11–51)

## 2015-02-12 LAB — COMPREHENSIVE METABOLIC PANEL
ALBUMIN: 4.7 g/dL (ref 3.5–5.0)
ALK PHOS: 78 U/L (ref 38–126)
ALT: 53 U/L (ref 17–63)
ANION GAP: 11 (ref 5–15)
AST: 33 U/L (ref 15–41)
BILIRUBIN TOTAL: 0.6 mg/dL (ref 0.3–1.2)
BUN: 14 mg/dL (ref 6–20)
CO2: 31 mmol/L (ref 22–32)
Calcium: 9.8 mg/dL (ref 8.9–10.3)
Chloride: 100 mmol/L — ABNORMAL LOW (ref 101–111)
Creatinine, Ser: 1.01 mg/dL (ref 0.61–1.24)
GFR calc Af Amer: >60 mL/min (ref >60)
GFR calc non Af Amer: >60 mL/min (ref >60)
GLUCOSE: 161 mg/dL — AB (ref 65–99)
POTASSIUM: 4.2 mmol/L (ref 3.5–5.1)
SODIUM: 142 mmol/L (ref 135–145)
TOTAL PROTEIN: 8 g/dL (ref 6.5–8.1)

## 2015-02-12 LAB — URINALYSIS, ROUTINE W REFLEX MICROSCOPIC
BILIRUBIN URINE: NEGATIVE
GLUCOSE, UA: NEGATIVE mg/dL
KETONES UR: 15 mg/dL — AB
Leukocytes, UA: NEGATIVE
NITRITE: NEGATIVE
PH: 6.5 (ref 5.0–8.0)
Protein, ur: NEGATIVE mg/dL
SPECIFIC GRAVITY, URINE: 1.028 (ref 1.005–1.030)

## 2015-02-12 LAB — URINE MICROSCOPIC-ADD ON: WBC UA: NONE SEEN WBC/hpf (ref 0–5)

## 2015-02-12 LAB — DIFFERENTIAL
Basophils Absolute: 0 10*3/uL (ref 0.0–0.1)
Basophils Relative: 0 %
Eosinophils Absolute: 0.2 10*3/uL (ref 0.0–0.7)
Eosinophils Relative: 1 %
LYMPHS ABS: 2.5 10*3/uL (ref 0.7–4.0)
Lymphocytes Relative: 13 %
MONOS PCT: 3 %
Monocytes Absolute: 0.6 10*3/uL (ref 0.1–1.0)
Neutro Abs: 15.7 10*3/uL — ABNORMAL HIGH (ref 1.7–7.7)
Neutrophils Relative %: 83 %

## 2015-02-12 LAB — CBC
HEMATOCRIT: 47.2 % (ref 39.0–52.0)
HEMOGLOBIN: 16 g/dL (ref 13.0–17.0)
MCH: 27.8 pg (ref 26.0–34.0)
MCHC: 33.9 g/dL (ref 30.0–36.0)
MCV: 82.1 fL (ref 78.0–100.0)
Platelets: 254 10*3/uL (ref 150–400)
RBC: 5.75 MIL/uL (ref 4.22–5.81)
RDW: 12.8 % (ref 11.5–15.5)
WBC: 19.1 10*3/uL — ABNORMAL HIGH (ref 4.0–10.5)

## 2015-02-12 MED ORDER — FENTANYL CITRATE (PF) 100 MCG/2ML IJ SOLN
INTRAMUSCULAR | Status: AC
  Administered 2015-02-12: 50 ug via NASAL
  Filled 2015-02-12: qty 2

## 2015-02-12 MED ORDER — FENTANYL CITRATE (PF) 100 MCG/2ML IJ SOLN
50.0000 ug | Freq: Once | INTRAMUSCULAR | Status: AC
  Administered 2015-02-12: 50 ug via NASAL

## 2015-02-12 NOTE — ED Notes (Signed)
Pt. reports epigastric pain with nausea , emesis and low back pain onset this evening , pt. added mild SOB , denies cough or congestion . No fever or diarrhea.

## 2015-02-12 NOTE — ED Notes (Signed)
Nurse explained delay ,  process and wait time to pt.

## 2015-02-13 ENCOUNTER — Emergency Department (HOSPITAL_COMMUNITY): Payer: Commercial Managed Care - HMO

## 2015-02-13 MED ORDER — SODIUM CHLORIDE 0.9 % IV BOLUS (SEPSIS): 1000.0000 mL | Freq: Once | INTRAVENOUS | Status: DC

## 2015-02-13 MED ORDER — ONDANSETRON 4 MG PO TBDP: 4.0000 mg | ORAL_TABLET | Freq: Three times a day (TID) | ORAL | Status: DC | PRN

## 2015-02-13 MED ORDER — MORPHINE SULFATE (PF) 4 MG/ML IV SOLN: 4.0000 mg | Freq: Once | INTRAVENOUS | Status: DC

## 2015-02-13 MED ORDER — OXYCODONE-ACETAMINOPHEN 5-325 MG PO TABS: 1.0000 | ORAL_TABLET | Freq: Four times a day (QID) | ORAL | Status: DC | PRN

## 2015-02-13 MED ORDER — OXYCODONE-ACETAMINOPHEN 5-325 MG PO TABS
2.0000 | ORAL_TABLET | Freq: Once | ORAL | Status: AC
  Administered 2015-02-13: 2 via ORAL
  Filled 2015-02-13: qty 2

## 2015-02-13 MED ORDER — ONDANSETRON HCL 4 MG/2ML IJ SOLN: 4.0000 mg | Freq: Once | INTRAMUSCULAR | Status: DC

## 2015-02-13 MED ORDER — ONDANSETRON 4 MG PO TBDP
4.0000 mg | ORAL_TABLET | Freq: Once | ORAL | Status: AC
  Administered 2015-02-13: 4 mg via ORAL
  Filled 2015-02-13: qty 1

## 2015-02-13 NOTE — ED Notes (Signed)
Pt drinking water, so far he is not having any difficulties

## 2015-02-13 NOTE — ED Provider Notes (Signed)
By signing my name below, I, Arlan Organshley Leger, attest that this documentation has been prepared under the direction and in the presence of Rocklyn Mayberry N Sophina Mitten, DO.  Electronically Signed: Arlan OrganAshley Leger, ED Scribe. 02/13/2015. 1:36 AM.   TIME SEEN: 1:29 AM   CHIEF COMPLAINT:  Chief Complaint  Patient presents with  . Abdominal Pain  . Back Pain     HPI:  HPI Comments: Christopher Fox is a 34 y.o. male with a PMHx of GERD who presents to the Emergency Department complaining of constant, ongoing epigastric and right upper quadrant sharp abdominal pain with associated nausea onset 6:00 PM this evening. Pain is made worse when on his R side. No alleviating factors at this time. No OTC medications or home remedies attempted prior to arrival. No recent fever, chills, vomiting, diarrhea, chest pain, or shortness of breath. No blood in stools. No black/tarry stools notes. He is not an every day drinker. No previous history of endoscopies. No previous history of abdominal surgeries. No history of heavy NSAID use. No history of peptic ulcer. No known history of gallstones.   PCP: Nelwyn SalisburyFRY,STEPHEN A, MD    ROS: See HPI Constitutional: no fever  Eyes: no drainage  ENT: no runny nose   Cardiovascular:  no chest pain  Resp: no SOB  GI: no vomiting. Positive abdominal pain GU: no dysuria Integumentary: no rash  Allergy: no hives  Musculoskeletal: no leg swelling  Neurological: no slurred speech ROS otherwise negative  PAST MEDICAL HISTORY/PAST SURGICAL HISTORY:  Past Medical History  Diagnosis Date  . Allergy   . GERD (gastroesophageal reflux disease)     MEDICATIONS:  Prior to Admission medications   Medication Sig Start Date End Date Taking? Authorizing Provider  omeprazole (PRILOSEC) 40 MG capsule TAKE 1 CAPSULE BY MOUTH DAILY 10/12/14   Nelwyn SalisburyStephen A Fry, MD    ALLERGIES:  No Known Allergies  SOCIAL HISTORY:  Social History  Substance Use Topics  . Smoking status: Former Smoker -- 0.00  packs/day for 0 years    Types: Cigarettes    Quit date: 04/16/2013  . Smokeless tobacco: Current User    Types: Snuff  . Alcohol Use: Yes     Comment: occ    FAMILY HISTORY: Family History  Problem Relation Age of Onset  . Hypertension    . Sudden death    . Stroke    . Asthma      EXAM: BP 138/85 mmHg  Pulse 60  Temp(Src) 97.3 F (36.3 C) (Oral)  Resp 22  Ht 6\' 3"  (1.905 m)  Wt 293 lb (132.904 kg)  BMI 36.62 kg/m2  SpO2 100% CONSTITUTIONAL: Alert and oriented and responds appropriately to questions. Appears uncomfortable but is afebrile, obese HEAD: Normocephalic EYES: Conjunctivae clear, PERRL ENT: normal nose; no rhinorrhea; moist mucous membranes; pharynx without lesions noted NECK: Supple, no meningismus, no LAD  CARD: RRR; S1 and S2 appreciated; no murmurs, no clicks, no rubs, no gallops RESP: Normal chest excursion without splinting or tachypnea; breath sounds clear and equal bilaterally; no wheezes, no rhonchi, no rales, no hypoxia or respiratory distress, speaking full sentences ABD/GI: Normal bowel sounds; non-distended; soft, Tenderness to palpation RUQ with voluntary gaurding, no tenderness at McBurney's point; no rebound, no peritoneal signs, negative Murphy sign BACK:  The back appears normal and is non-tender to palpation, there is no CVA tenderness EXT: Normal ROM in all joints; non-tender to palpation; no edema; normal capillary refill; no cyanosis, no calf tenderness or swelling  SKIN: Normal color for age and race; warm NEURO: Moves all extremities equally, sensation to light touch intact diffusely, cranial nerves II through XII intact PSYCH: The patient's mood and manner are appropriate. Grooming and personal hygiene are appropriate.  MEDICAL DECISION MAKING: Patient here with right upper quadrant tenderness. Differential diagnosis includes cholelithiasis, choledocholithiasis, cholecystitis, pancreatitis, gastritis, peptic ulcer. We'll obtain right  upper quadrant ultrasound. Labs show leukocytosis of 19,000 with left shift. He has previously had leukocytosis with labs in 2014 but today's leukocytosis is higher. He is afebrile with a negative Murphy sign. Given intranasal fentanyl in triage and reports feeling better. We'll give IV fluids, pain and nausea medicine.  ED PROGRESS: Right upper quadrant ultrasound shows cholelithiasis without choledocholithiasis or signs of cholecystitis. Patient has not yet received IV medications as he is a difficult IV stick. Discussed with patient that his imaging shows no sign of cholecystitis but he does have an elevated white blood cell count.  Discussed options of consulting surgery to see pt in ED for possible admission and surgery versus dc home with outpatient follow-up. He reports he feels that his pain is well controlled after oral medications and he is no longer nauseous. He is drinking without difficulty. He states that he does not want to stay for surgery at this time and will follow-up with surgery as an outpatient. Will consult surgery on call for further recommendations.   3:15 AM  D/w Dr. Harlon Flor with general surgery who agrees with above plan. Discussed strict return precautions with patient. We'll discharge with Zofran and Percocet. Patient and significant other at bedside verbalize understanding and are comfortable with this plan. Patient is still afebrile. His abdominal exam is still benign with a negative Murphy sign.      I personally performed the services described in this documentation, which was scribed in my presence. The recorded information has been reviewed and is accurate.   [name], DO 02/13/15 [phone]

## 2015-02-13 NOTE — Discharge Instructions (Signed)
Biliary Colic °Biliary colic is a pain in the upper abdomen. The pain: °· Is usually felt on the right side of the abdomen, but it may also be felt in the center of the abdomen, just below the breastbone (sternum). °· May spread back toward the right shoulder blade. °· May be steady or irregular. °· May be accompanied by nausea and vomiting. °Most of the time, the pain goes away in 1-5 hours. After the most intense pain passes, the abdomen may continue to ache mildly for about 24 hours. °Biliary colic is caused by a blockage in the bile duct. The bile duct is a pathway that carries bile--a liquid that helps to digest fats--from the gallbladder to the small intestine. Biliary colic usually occurs after eating, when the digestive system demands bile. The pain develops when muscle cells contract forcefully to try to move the blockage so that bile can get by. °HOME CARE INSTRUCTIONS °· Take medicines only as directed by your health care provider. °· Drink enough fluid to keep your urine clear or pale yellow. °· Avoid fatty, greasy, and fried foods. These kinds of foods increase your body's demand for bile. °· Avoid any foods that make your pain worse. °· Avoid overeating. °· Avoid having a large meal after fasting. °SEEK MEDICAL CARE IF: °· You develop a fever. °· Your pain gets worse. °· You vomit. °· You develop nausea that prevents you from eating and drinking. °SEEK IMMEDIATE MEDICAL CARE IF: °· You suddenly develop a fever and shaking chills. °· You develop a yellowish discoloration (jaundice) of: °· Skin. °· Whites of the eyes. °· Mucous membranes. °· You have continuous or severe pain that is not relieved with medicines. °· You have nausea and vomiting that is not relieved with medicines. °· You develop dizziness or you faint. °  °This information is not intended to replace advice given to you by your health care provider. Make sure you discuss any questions you have with your health care provider. °  °Document  Released: 08/03/2005 Document Revised: 07/17/2014 Document Reviewed: 12/12/2013 °Elsevier Interactive Patient Education ©2016 Elsevier Inc. ° °Cholelithiasis °Cholelithiasis (also called gallstones) is a form of gallbladder disease in which gallstones form in your gallbladder. The gallbladder is an organ that stores bile made in the liver, which helps digest fats. Gallstones begin as small crystals and slowly grow into stones. Gallstone pain occurs when the gallbladder spasms and a gallstone is blocking the duct. Pain can also occur when a stone passes out of the duct.  °RISK FACTORS °· Being male.   °· Having multiple pregnancies. Health care providers sometimes advise removing diseased gallbladders before future pregnancies.   °· Being obese. °· Eating a diet heavy in fried foods and fat.   °· Being older than 60 years and increasing age.   °· Prolonged use of medicines containing male hormones.   °· Having diabetes mellitus.   °· Rapidly losing weight.   °· Having a family history of gallstones (heredity).   °SYMPTOMS °· Nausea.   °· Vomiting. °· Abdominal pain.   °· Yellowing of the skin (jaundice).   °· Sudden pain. It may persist from several minutes to several hours. °· Fever.   °· Tenderness to the touch.  °In some cases, when gallstones do not move into the bile duct, people have no pain or symptoms. These are called "silent" gallstones.  °TREATMENT °Silent gallstones do not need treatment. In severe cases, emergency surgery may be required. Options for treatment include: °· Surgery to remove the gallbladder. This is the most common treatment. °·   Medicines. These do not always work and may take 6-12 months or more to work. °· Shock wave treatment (extracorporeal biliary lithotripsy). In this treatment an ultrasound machine sends shock waves to the gallbladder to break gallstones into smaller pieces that can pass into the intestines or be dissolved by medicine. °HOME CARE INSTRUCTIONS  °· Only take  over-the-counter or prescription medicines for pain, discomfort, or fever as directed by your health care provider.   °· Follow a low-fat diet until seen again by your health care provider. Fat causes the gallbladder to contract, which can result in pain.   °· Follow up with your health care provider as directed. Attacks are almost always recurrent and surgery is usually required for permanent treatment.   °SEEK IMMEDIATE MEDICAL CARE IF:  °· Your pain increases and is not controlled by medicines.   °· You have a fever or persistent symptoms for more than 2-3 days.   °· You have a fever and your symptoms suddenly get worse.   °· You have persistent nausea and vomiting.   °MAKE SURE YOU:  °· Understand these instructions. °· Will watch your condition. °· Will get help right away if you are not doing well or get worse. °  °This information is not intended to replace advice given to you by your health care provider. Make sure you discuss any questions you have with your health care provider. °  °Document Released: 02/26/2005 Document Revised: 11/02/2012 Document Reviewed: 08/24/2012 °Elsevier Interactive Patient Education ©2016 Elsevier Inc. °Low-Fat Diet for Pancreatitis or Gallbladder Conditions °A low-fat diet can be helpful if you have pancreatitis or a gallbladder condition. With these conditions, your pancreas and gallbladder have trouble digesting fats. A healthy eating plan with less fat will help rest your pancreas and gallbladder and reduce your symptoms. °WHAT DO I NEED TO KNOW ABOUT THIS DIET? °· Eat a low-fat diet. °¨ Reduce your fat intake to less than 20-30% of your total daily calories. This is less than 50-60 g of fat per day. °¨ Remember that you need some fat in your diet. Ask your dietician what your daily goal should be. °¨ Choose nonfat and low-fat healthy foods. Look for the words "nonfat," "low fat," or "fat free." °¨ As a guide, look on the label and choose foods with less than 3 g of fat per  serving. Eat only one serving. °· Avoid alcohol. °· Do not smoke. If you need help quitting, talk with your health care provider. °· Eat small frequent meals instead of three large heavy meals. °WHAT FOODS CAN I EAT? °Grains °Include healthy grains and starches such as potatoes, wheat bread, fiber-rich cereal, and brown rice. Choose whole grain options whenever possible. In adults, whole grains should account for 45-65% of your daily calories.  °Fruits and Vegetables °Eat plenty of fruits and vegetables. Fresh fruits and vegetables add fiber to your diet. °Meats and Other Protein Sources °Eat lean meat such as chicken and pork. Trim any fat off of meat before cooking it. Eggs, fish, and beans are other sources of protein. In adults, these foods should account for 10-35% of your daily calories. °Dairy °Choose low-fat milk and dairy options. Dairy includes fat and protein, as well as calcium.  °Fats and Oils °Limit high-fat foods such as fried foods, sweets, baked goods, sugary drinks.  °Other °Creamy sauces and condiments, such as mayonnaise, can add extra fat. Think about whether or not you need to use them, or use smaller amounts or low fat options. °WHAT FOODS ARE NOT RECOMMENDED? °·   High fat foods, such as: °¨ Baked goods. °¨ Ice cream. °¨ French toast. °¨ Sweet rolls. °¨ Pizza. °¨ Cheese bread. °¨ Foods covered with batter, butter, creamy sauces, or cheese. °¨ Fried foods. °¨ Sugary drinks and desserts. °· Foods that cause gas or bloating °  °This information is not intended to replace advice given to you by your health care provider. Make sure you discuss any questions you have with your health care provider. °  °Document Released: 03/07/2013 Document Reviewed: 03/07/2013 °Elsevier Interactive Patient Education ©2016 Elsevier Inc. ° °

## 2015-02-15 ENCOUNTER — Telehealth: Payer: Self-pay | Admitting: *Deleted

## 2015-02-15 ENCOUNTER — Ambulatory Visit (INDEPENDENT_AMBULATORY_CARE_PROVIDER_SITE_OTHER): Payer: Commercial Managed Care - HMO | Admitting: Family Medicine

## 2015-02-15 ENCOUNTER — Encounter: Payer: Self-pay | Admitting: Family Medicine

## 2015-02-15 VITALS — BP 115/82 | HR 90 | Temp 98.6°F | Ht 75.0 in | Wt 292.0 lb

## 2015-02-15 DIAGNOSIS — K8020 Calculus of gallbladder without cholecystitis without obstruction: Secondary | ICD-10-CM

## 2015-02-15 MED ORDER — MORPHINE SULFATE 15 MG PO TABS: 15.0000 mg | ORAL_TABLET | Freq: Four times a day (QID) | ORAL | Status: DC | PRN

## 2015-02-15 NOTE — Progress Notes (Signed)
Pre visit review using our clinic review tool, if applicable. No additional management support is needed unless otherwise documented below in the visit note. 

## 2015-02-15 NOTE — Telephone Encounter (Signed)
Pt is on schedule to see [name] today 02/15/2015.

## 2015-02-15 NOTE — Progress Notes (Signed)
   Subjective:    Patient ID: Christopher Fox, male    DOB: 10/17/1980, 34 y.o.   MRN: [id number]  HPI Here to follow up an ER visit on 02-13-15 for RUQ pain and nausea. No fever. At the ER an abdominal US showed multiple stones in the gall bladder but no signs of acute cholecystitis. However his WBC that day was 19 with a sizable left shift. The ER doctor consulted by phone with Surgery and they decided Christopher Fox could go home and be seen out patient. He was given some Percocet for pain, but this has not helped much at all. He still has bouts  of severe epigastric and RUQ pain. He gets nauseated but has not vomited. No fevers. His BMs are regular. He has an appt to see Christopher Fox on 03-04-15.    Review of Systems  Constitutional: Negative.   Respiratory: Negative.   Cardiovascular: Negative.   Gastrointestinal: Positive for nausea and abdominal pain. Negative for vomiting, diarrhea, constipation, blood in stool, abdominal distention, anal bleeding and rectal pain.  Genitourinary: Negative.        Objective:   Physical Exam  Constitutional: He appears well-developed and well-nourished.  Cardiovascular: Normal rate, regular rhythm, normal heart sounds and intact distal pulses.   Pulmonary/Chest: Effort normal and breath sounds normal.  Abdominal: Soft. Bowel sounds are normal. He exhibits no distension and no mass. There is no rebound and no guarding.  Very tender over the RUQ          Assessment & Plan:  Symptomatic gall stones. He will try MS 15 mg for pain. We will see if the Surgery consult can be moved up sooner.

## 2015-02-15 NOTE — Telephone Encounter (Signed)
Patient Name: Christopher Fox Gender: Male DOB: 08/21/1980 Age: [age] Y 10 M 24 D Return Phone Number: [phone] (Primary), [phone] (Secondary) Address: City/State/Zip: [place] Client [facility] Night - Client Client Site [facility] - Night Physician Gershon CraneFry, Stephen Contact Type Call Call Type Triage / Clinical Caller Name Excell SeltzerKathy Adams Relationship To Patient Mother Return Phone Number [phone]) [phone] (Primary) Chief Complaint Abdominal Pain Initial Comment Caller states her son spent Tuesday night in the ER with acute gallstones. He is not in extreme pain but still can't get any relief. No Triage Reason Other Nurse Assessment Nurse: Lane HackerHarley, RN, Elvin SoWindy Date/Time Lamount Cohen(Eastern Time): 02/15/2015 8:02:05 AM Confirm and document reason for call. If symptomatic, describe symptoms. ---Caller states that her son spent Tuesday night in the ER with acute gallstones confirmed by MRI. He is not in extreme pain but still can't get any relief. Eases off with oxycodon but leery of taking them. No fever. Has appt for Dec. 19 but can't want, and feels like he needs to be seen today. Should they go back to ER or come to see MD? -- He is not with her currently. He is not available by phone right now. Has the patient traveled out of the country within the last 30 days? ---Not Applicable Does the patient have any new or worsening symptoms? ---Yes Will a triage be completed? ---No Select reason for no triage. ---Other Please document clinical information provided and list any resource used. ---Caller not with patient. RN advised that they call back when she is with him or to have him call back when he is not driving and safe. Caller verb. understanding. Guidelines Guideline Title Affirmed Question Affirmed Notes Nurse Date/Time Odis Luster(Easter

## 2015-02-18 ENCOUNTER — Telehealth: Payer: Self-pay | Admitting: Family Medicine

## 2015-02-18 NOTE — Telephone Encounter (Signed)
PLEASE NOTE: All timestamps contained within this report are represented as [place] Standard Time. CONFIDENTIALTY NOTICE: This fax transmission is intended only for the addressee. It contains information that is legally privileged, confidential or otherwise protected from use or disclosure. If you are not the intended recipient, you are strictly prohibited from reviewing, disclosing, copying using or disseminating any of this information or taking any action in reliance on or regarding this information. If you have received this fax in error, please notify us immediately by telephone so that we can arrange for its return to us. Phone: [phone], Toll-Free: [phone], Fax: [phone] Page: 1 of 1 Call Id: [id number] [facility] Day - Client TELEPHONE ADVICE RECORD [facility] Patient Name: Christopher Fox DOB: 11/10/1980 Initial Comment caller was in the ER on tuesday with gall stones - saw Dr Clent RidgesFry on Friday - his pain is gone. His bowel movements looked were small balls - wants to know if he passed the gall stones in his bowel movements Nurse Assessment Nurse: Elijah Birkaldwell, RN, Stark BrayLynda Date/Time (Eastern Time): 02/18/2015 11:14:18 AM Confirm and document reason for call. If symptomatic, describe symptoms. ---Caller was in the ER on Tuesday with gall stones. He saw [name] on Friday. He has been taking Hydrocodone for pain until yesterday. His pain is gone, no symptoms. His bowel movements looked like small balls, wants to know if he passed the gall stones in his bowel movements. Has the patient traveled out of the country within the last 30 days? ---Not Applicable Does the patient have any new or worsening symptoms? ---No Please document clinical information provided and list any resource used. ---Caller thinks his bowel movements may be a bit hard/ constipated. Nurse advised that when taking pain meds. it is important to stay up on fluids as they can  have a constipating effect. Nurse does not think gallstones would be passed through bowel movements, not sure. Advised that usually what causes gall bladder symptoms is the inability for the gallstones to pass through the bile ducts. Caller states he meets with a surgeon on the 19th for gall bladder to be removed. No symptoms at this time to triage. Guidelines Guideline Title Affirmed Question Affirmed Notes Final Disposition User Clinical Call Newtonaldwell, RN, Stark BrayLynda

## 2015-02-18 NOTE — Telephone Encounter (Signed)
No interventions at this time just an [place]

## 2015-02-25 ENCOUNTER — Ambulatory Visit: Payer: Self-pay | Admitting: General Surgery

## 2015-02-25 NOTE — H&P (Signed)
Christopher ReddenSamuel H. Fox 02/25/2015 4:07 PM Location: [facility] Patient #: [id number] DOB: 05/17/1980 Married / Language: [place] / Race: White Male  History of Present Illness Christopher Fox([name] MD; 02/25/2015 5:27 PM) Patient words: New-.  The patient is a 34 year old male.   Note:He is referred by [name] because of symptomatic cholelithiasis. He presented to the emergency department early February 13, 2015 because of constant ongoing severe epigastric right upper quadrant pain with nausea and vomiting that had been going on for about 7 hours. He has an elevation of his white blood cell count of 19,000 with a left shift. Liver function tests were not elevated. Ultrasound demonstrated cholelithiasis without evidence of acute cholecystitis. He seemed to be better with oral pain medication and was discharged to home. However, he states he continued to have a severe pain and went and saw [name] 2 days afterwards because of the severe pain. He was significantly tender in the right upper quadrant. Over time he slowly got better. In hindsight, he's had similar episodes but not this severe. Note was that he lost 60 pounds relatively abruptly earlier this year and has gained some of it back. No family history of gallbladder disease. It took approximately 1 week for his pain to resolve.  Other Problems Doristine Devoid(Christopher Fox, CMA; 02/25/2015 4:07 PM) Cholelithiasis Gastroesophageal Reflux Disease  Past Surgical History Doristine Devoid(Christopher Fox, CMA; 02/25/2015 4:07 PM) Knee Surgery Right. Oral Surgery Tonsillectomy  Diagnostic Studies History Doristine Devoid(Christopher Fox, CMA; 02/25/2015 4:07 PM) Colonoscopy never  Allergies Doristine Devoid(Christopher Fox, CMA; 02/25/2015 4:08 PM) No Known Drug Allergies 02/25/2015  Medication History Doristine Devoid(Christopher Fox, CMA; 02/25/2015 4:08 PM) No Current Medications Medications Reconciled  Social History Doristine Devoid(Christopher Fox, CMA; 02/25/2015 4:07 PM) Alcohol use Occasional alcohol  use. Caffeine use Carbonated beverages, Tea. No drug use Tobacco use Former smoker.  Family History Doristine Devoid(Christopher Fox, CMA; 02/25/2015 4:07 PM) Alcohol Abuse Father. Hypertension Mother.     Review of Systems Doristine Devoid(Christopher Fox CMA; 02/25/2015 4:07 PM) General Not Present- Appetite Loss, Chills, Fatigue, Fever, Night Sweats, Weight Gain and Weight Loss. Skin Not Present- Change in Wart/Mole, Dryness, Hives, Jaundice, New Lesions, Non-Healing Wounds, Rash and Ulcer. HEENT Not Present- Earache, Hearing Loss, Hoarseness, Nose Bleed, Oral Ulcers, Ringing in the Ears, Seasonal Allergies, Sinus Pain, Sore Throat, Visual Disturbances, Wears glasses/contact lenses and Yellow Eyes. Respiratory Present- Snoring. Not Present- Bloody sputum, Chronic Cough, Difficulty Breathing and Wheezing. Breast Not Present- Breast Mass, Breast Pain, Nipple Discharge and Skin Changes. Cardiovascular Not Present- Chest Pain, Difficulty Breathing Lying Down, Leg Cramps, Palpitations, Rapid Heart Rate, Shortness of Breath and Swelling of Extremities. Gastrointestinal Present- Indigestion. Not Present- Abdominal Pain, Bloating, Bloody Stool, Change in Bowel Habits, Chronic diarrhea, Constipation, Difficulty Swallowing, Excessive gas, Gets full quickly at meals, Hemorrhoids, Nausea, Rectal Pain and Vomiting. Male Genitourinary Not Present- Blood in Urine, Change in Urinary Stream, Frequency, Impotence, Nocturia, Painful Urination, Urgency and Urine Leakage. Musculoskeletal Not Present- Back Pain, Joint Pain, Joint Stiffness, Muscle Pain, Muscle Weakness and Swelling of Extremities. Neurological Not Present- Decreased Memory, Fainting, Headaches, Numbness, Seizures, Tingling, Tremor, Trouble walking and Weakness. Psychiatric Not Present- Anxiety, Bipolar, Change in Sleep Pattern, Depression, Fearful and Frequent crying. Endocrine Not Present- Cold Intolerance, Excessive Hunger, Hair Changes, Heat Intolerance, Hot flashes and  New Diabetes. Hematology Not Present- Easy Bruising, Excessive bleeding, Gland problems, HIV and Persistent Infections.  Vitals (Christopher Fox CMA; 02/25/2015 4:08 PM) 02/25/2015 4:07 PM Weight: 292 lb Height: 75in Body Surface Area: 2.58 m Body Mass  Index: 36.5 kg/m  Temp.: 98.57F(Oral)  Pulse: 78 (Regular)  BP: 132/82 (Sitting, Left Arm, Standard)      Physical Exam Christopher Pollack MD; 02/25/2015 5:29 PM)  The physical exam findings are as follows: Note:General: Overweight male in NAD. Pleasant and cooperative.  HEENT: [place]/AT, no facial masses  EYES: EOMI, no icterus  CV: RRR, no murmur, no JVD.  CHEST: Breath sounds equal and clear. Respirations nonlabored.  BREASTS: Symmetrical in size. No dominant masses, nipple discharge or suspicious skin lesions.  ABDOMEN: Soft, mild right upper quadrant tenderness, no masses.  SKIN: No jaundice .  NEUROLOGIC: Alert and oriented, answers questions appropriately, normal gait and station.  PSYCHIATRIC: Normal mood, affect , and behavior.    Assessment & Plan Christopher Pollack MD; 02/25/2015 5:01 PM)  SYMPTOMATIC CHOLELITHIASIS (K80.20) Impression: I'm concerned with the significant elevation of the white blood cell count and a prolonged pain he had that he had an element of acute cholecystitis which is slowly resolving. He ended up seeing Dr. Clent Ridges 2 days after his emergency department visit and had to be given more pain medication. He was also very tender in the right upper quadrant at that time. He currently is asymptomatic.  Plan: I suggested waiting 6 weeks from November 30 as I think he did have some acute cholecystitis and this will allow the induration to go down and make the operation potentially less complicated. I discussed this with him and his wife at length. Given the fact that he is afebrile and asymptomatic at this time, I do not think he needs antibiotics. I have explained the procedure, risks, and  aftercare of cholecystectomy. Risks include but are not limited to bleeding, infection, wound problems, anesthesia, diarrhea, bile leak, injury to common bile duct/liver/intestine. He seems to understand and agrees with the plan.  Avel Peace, MD

## 2015-03-15 ENCOUNTER — Encounter (HOSPITAL_COMMUNITY)
Admission: RE | Admit: 2015-03-15 | Discharge: 2015-03-15 | Disposition: A | Discharge status: Home / Self Care | Payer: Commercial Managed Care - HMO | Source: Ambulatory Visit | Attending: General Surgery | Admitting: General Surgery

## 2015-03-15 ENCOUNTER — Encounter (HOSPITAL_COMMUNITY): Payer: Self-pay

## 2015-03-15 DIAGNOSIS — K8010 Calculus of gallbladder with chronic cholecystitis without obstruction: Secondary | ICD-10-CM | POA: Diagnosis not present

## 2015-03-15 DIAGNOSIS — Z01818 Encounter for other preprocedural examination: Secondary | ICD-10-CM | POA: Insufficient documentation

## 2015-03-15 LAB — CBC WITH DIFFERENTIAL/PLATELET
BASOS PCT: 0 %
Basophils Absolute: 0 10*3/uL (ref 0.0–0.1)
EOS ABS: 0.4 10*3/uL (ref 0.0–0.7)
EOS PCT: 3 %
HCT: 44.2 % (ref 39.0–52.0)
HEMOGLOBIN: 15.2 g/dL (ref 13.0–17.0)
LYMPHS ABS: 2.5 10*3/uL (ref 0.7–4.0)
Lymphocytes Relative: 23 %
MCH: 27.4 pg (ref 26.0–34.0)
MCHC: 34.4 g/dL (ref 30.0–36.0)
MCV: 79.8 fL (ref 78.0–100.0)
MONO ABS: 1.1 10*3/uL — AB (ref 0.1–1.0)
MONOS PCT: 10 %
Neutro Abs: 7 10*3/uL (ref 1.7–7.7)
Neutrophils Relative %: 64 %
PLATELETS: 233 10*3/uL (ref 150–400)
RBC: 5.54 MIL/uL (ref 4.22–5.81)
RDW: 13 % (ref 11.5–15.5)
WBC: 10.9 10*3/uL — ABNORMAL HIGH (ref 4.0–10.5)

## 2015-03-15 LAB — COMPREHENSIVE METABOLIC PANEL
ALBUMIN: 4.8 g/dL (ref 3.5–5.0)
ALK PHOS: 73 U/L (ref 38–126)
ALT: 44 U/L (ref 17–63)
ANION GAP: 9 (ref 5–15)
AST: 25 U/L (ref 15–41)
BUN: 14 mg/dL (ref 6–20)
CALCIUM: 9.1 mg/dL (ref 8.9–10.3)
CO2: 25 mmol/L (ref 22–32)
Chloride: 105 mmol/L (ref 101–111)
Creatinine, Ser: 0.75 mg/dL (ref 0.61–1.24)
GFR calc non Af Amer: >60 mL/min (ref >60)
Glucose, Bld: 91 mg/dL (ref 65–99)
POTASSIUM: 4.1 mmol/L (ref 3.5–5.1)
SODIUM: 139 mmol/L (ref 135–145)
Total Bilirubin: 0.9 mg/dL (ref 0.3–1.2)
Total Protein: 7.9 g/dL (ref 6.5–8.1)

## 2015-03-15 LAB — PROTIME-INR
INR: 0.97 (ref 0.00–1.49)
Prothrombin Time: 13.1 seconds (ref 11.6–15.2)

## 2015-03-15 NOTE — Patient Instructions (Signed)
Caralyn GuileSamuel H Marotto  03/15/2015   Your procedure is scheduled on: Wednesday March 27, 2015  [facility]  [place] to 3rd floor to  Short Stay Center at 6:30 AM.  Call this number if you have problems the morning of surgery [phone]   Remember: ONLY 1 PERSON MAY GO WITH YOU TO SHORT STAY TO GET  READY MORNING OF YOUR SURGERY.  Do not eat food or drink liquids :After Midnight.     Take these medicines the morning of surgery with A SIP OF WATER: NONE                               You may not have any metal on your body including hair pins and              piercings  Do not wear jewelry, lotions, powders or colognes, deodorant                           Men may shave face and neck.   Do not bring valuables to the hospital. [facility] IS NOT             RESPONSIBLE   FOR VALUABLES.  Contacts, dentures or bridgework may not be worn into surgery.      Patients discharged the day of surgery will not be allowed to drive home.  Name and phone number of your driver:Amy Angello (wife)  _____________________________________________________________________             [facility] Pre[facility] Before surgery, you can play an important role.  Because skin is not sterile, your skin needs to be as free of germs as possible.  You can reduce the number of germs on your skin by washing with CHG (chlorahexidine gluconate) soap before surgery.  CHG is an antiseptic cleaner which kills germs and bonds with the skin to continue killing germs even after washing. Please DO NOT use if you have an allergy to CHG or antibacterial soaps.  If your skin becomes reddened/irritated stop using the CHG and inform your nurse when you arrive at Short Stay. Do not shave (including legs and underarms) for at least 48 hours prior to the first CHG shower.  You may shave your face/neck. Please follow these instructions carefully:  1.  Shower with CHG  Soap the night before surgery and the  morning of Surgery.  2.  If you choose to wash your hair, wash your hair first as usual with your  normal  shampoo.  3.  After you shampoo, rinse your hair and body thoroughly to remove the  shampoo.                           4.  Use CHG as you would any other liquid soap.  You can apply chg directly  to the skin and wash                       Gently with a scrungie or clean washcloth.  5.  Apply the CHG Soap to your body ONLY FROM THE NECK DOWN.   Do not use on face/ open  Wound or open sores. Avoid contact with eyes, ears mouth and genitals (private parts).                       Wash face,  Genitals (private parts) with your normal soap.             6.  Wash thoroughly, paying special attention to the area where your surgery  will be performed.  7.  Thoroughly rinse your body with warm water from the neck down.  8.  DO NOT shower/wash with your normal soap after using and rinsing off  the CHG Soap.                9.  Pat yourself dry with a clean towel.            10.  Wear clean pajamas.            11.  Place clean sheets on your bed the night of your first shower and do not  sleep with pets. Day of Surgery : Do not apply any lotions/deodorants the morning of surgery.  Please wear clean clothes to the hospital/surgery center.  FAILURE TO FOLLOW THESE INSTRUCTIONS MAY RESULT IN THE CANCELLATION OF YOUR SURGERY PATIENT SIGNATURE_________________________________  NURSE SIGNATURE__________________________________  ________________________________________________________________________

## 2015-03-15 NOTE — Progress Notes (Signed)
Your patient has screened at an elevated risk for Obstructive Sleep Apnea using the Stop-Bang Tool during a pre-surgical visit. Pt scored at high risk.  

## 2015-03-15 NOTE — Progress Notes (Signed)
EKG / epic 02/13/2015

## 2015-03-27 ENCOUNTER — Ambulatory Visit (HOSPITAL_COMMUNITY): Payer: Commercial Managed Care - HMO | Admitting: Certified Registered Nurse Anesthetist

## 2015-03-27 ENCOUNTER — Ambulatory Visit (HOSPITAL_COMMUNITY)
Admission: RE | Admit: 2015-03-27 | Discharge: 2015-03-28 | Disposition: A | Discharge status: Home / Self Care | Payer: Commercial Managed Care - HMO | Source: Ambulatory Visit | Attending: General Surgery | Admitting: General Surgery

## 2015-03-27 ENCOUNTER — Encounter (HOSPITAL_COMMUNITY)
Admission: RE | Disposition: A | Discharge status: Home / Self Care | Payer: Self-pay | Source: Ambulatory Visit | Attending: General Surgery

## 2015-03-27 ENCOUNTER — Encounter (HOSPITAL_COMMUNITY): Payer: Self-pay | Admitting: *Deleted

## 2015-03-27 ENCOUNTER — Ambulatory Visit (HOSPITAL_COMMUNITY): Payer: Commercial Managed Care - HMO

## 2015-03-27 DIAGNOSIS — Z6836 Body mass index (BMI) 36.0-36.9, adult: Secondary | ICD-10-CM | POA: Diagnosis not present

## 2015-03-27 DIAGNOSIS — K8012 Calculus of gallbladder with acute and chronic cholecystitis without obstruction: Secondary | ICD-10-CM | POA: Diagnosis not present

## 2015-03-27 DIAGNOSIS — Z87891 Personal history of nicotine dependence: Secondary | ICD-10-CM | POA: Insufficient documentation

## 2015-03-27 DIAGNOSIS — E669 Obesity, unspecified: Secondary | ICD-10-CM | POA: Insufficient documentation

## 2015-03-27 DIAGNOSIS — K812 Acute cholecystitis with chronic cholecystitis: Secondary | ICD-10-CM | POA: Diagnosis present

## 2015-03-27 DIAGNOSIS — K8020 Calculus of gallbladder without cholecystitis without obstruction: Secondary | ICD-10-CM | POA: Diagnosis present

## 2015-03-27 DIAGNOSIS — K219 Gastro-esophageal reflux disease without esophagitis: Secondary | ICD-10-CM | POA: Insufficient documentation

## 2015-03-27 DIAGNOSIS — K8010 Calculus of gallbladder with chronic cholecystitis without obstruction: Secondary | ICD-10-CM

## 2015-03-27 HISTORY — PX: CHOLECYSTECTOMY: SHX55

## 2015-03-27 SURGERY — LAPAROSCOPIC CHOLECYSTECTOMY WITH INTRAOPERATIVE CHOLANGIOGRAM
Anesthesia: General

## 2015-03-27 MED ORDER — ONDANSETRON HCL 4 MG/2ML IJ SOLN
INTRAMUSCULAR | Status: AC
  Filled 2015-03-27: qty 2

## 2015-03-27 MED ORDER — BUPIVACAINE HCL 0.5 % IJ SOLN
INTRAMUSCULAR | Status: DC | PRN
  Administered 2015-03-27: 15 mL

## 2015-03-27 MED ORDER — HEPARIN SODIUM (PORCINE) 5000 UNIT/ML IJ SOLN
5000.0000 [IU] | Freq: Three times a day (TID) | INTRAMUSCULAR | Status: DC
  Administered 2015-03-28: 5000 [IU] via SUBCUTANEOUS
  Filled 2015-03-27 (×3): qty 1

## 2015-03-27 MED ORDER — GLYCOPYRROLATE 0.2 MG/ML IJ SOLN
INTRAMUSCULAR | Status: DC | PRN
  Administered 2015-03-27: .8 mg via INTRAVENOUS

## 2015-03-27 MED ORDER — ONDANSETRON HCL 4 MG/2ML IJ SOLN: 4.0000 mg | INTRAMUSCULAR | Status: DC | PRN

## 2015-03-27 MED ORDER — SUCCINYLCHOLINE CHLORIDE 20 MG/ML IJ SOLN
INTRAMUSCULAR | Status: DC | PRN
  Administered 2015-03-27: 100 mg via INTRAVENOUS

## 2015-03-27 MED ORDER — ONDANSETRON HCL 4 MG/2ML IJ SOLN
INTRAMUSCULAR | Status: DC | PRN
  Administered 2015-03-27: 4 mg via INTRAVENOUS

## 2015-03-27 MED ORDER — HYDROMORPHONE HCL 1 MG/ML IJ SOLN
0.2500 mg | INTRAMUSCULAR | Status: DC | PRN
  Administered 2015-03-27 (×2): 0.5 mg via INTRAVENOUS

## 2015-03-27 MED ORDER — PROPOFOL 10 MG/ML IV BOLUS
INTRAVENOUS | Status: DC | PRN
  Administered 2015-03-27: 200 mg via INTRAVENOUS
  Administered 2015-03-27: 100 mg via INTRAVENOUS

## 2015-03-27 MED ORDER — FAMOTIDINE 20 MG PO TABS
20.0000 mg | ORAL_TABLET | Freq: Every day | ORAL | Status: DC
  Administered 2015-03-27: 20 mg via ORAL
  Filled 2015-03-27 (×2): qty 1

## 2015-03-27 MED ORDER — OXYCODONE HCL 5 MG PO TABS
5.0000 mg | ORAL_TABLET | ORAL | Status: DC | PRN
  Administered 2015-03-27 – 2015-03-28 (×3): 10 mg via ORAL
  Filled 2015-03-27 (×3): qty 2

## 2015-03-27 MED ORDER — METOPROLOL TARTRATE 1 MG/ML IV SOLN
INTRAVENOUS | Status: DC | PRN
  Administered 2015-03-27: 1 mg via INTRAVENOUS

## 2015-03-27 MED ORDER — MIDAZOLAM HCL 2 MG/2ML IJ SOLN
INTRAMUSCULAR | Status: AC
  Filled 2015-03-27: qty 2

## 2015-03-27 MED ORDER — BUPIVACAINE HCL (PF) 0.5 % IJ SOLN
INTRAMUSCULAR | Status: AC
  Filled 2015-03-27: qty 30

## 2015-03-27 MED ORDER — LIDOCAINE HCL (CARDIAC) 20 MG/ML IV SOLN
INTRAVENOUS | Status: AC
  Filled 2015-03-27: qty 5

## 2015-03-27 MED ORDER — LACTATED RINGERS IV SOLN
INTRAVENOUS | Status: DC | PRN
  Administered 2015-03-27: 3000 mL

## 2015-03-27 MED ORDER — PROMETHAZINE HCL 25 MG/ML IJ SOLN: 6.2500 mg | INTRAMUSCULAR | Status: DC | PRN

## 2015-03-27 MED ORDER — MORPHINE SULFATE (PF) 2 MG/ML IV SOLN
2.0000 mg | INTRAVENOUS | Status: DC | PRN
  Administered 2015-03-27: 2 mg via INTRAVENOUS
  Administered 2015-03-27 – 2015-03-28 (×4): 4 mg via INTRAVENOUS
  Filled 2015-03-27: qty 2
  Filled 2015-03-27: qty 1
  Filled 2015-03-27 (×3): qty 2

## 2015-03-27 MED ORDER — HYDROMORPHONE HCL 1 MG/ML IJ SOLN
INTRAMUSCULAR | Status: AC
  Filled 2015-03-27: qty 1

## 2015-03-27 MED ORDER — ONDANSETRON 4 MG PO TBDP
4.0000 mg | ORAL_TABLET | Freq: Four times a day (QID) | ORAL | Status: DC | PRN
  Administered 2015-03-28: 4 mg via ORAL
  Filled 2015-03-27: qty 1

## 2015-03-27 MED ORDER — PROPOFOL 10 MG/ML IV BOLUS
INTRAVENOUS | Status: AC
  Filled 2015-03-27: qty 20

## 2015-03-27 MED ORDER — ROCURONIUM BROMIDE 100 MG/10ML IV SOLN
INTRAVENOUS | Status: DC | PRN
  Administered 2015-03-27: 50 mg via INTRAVENOUS
  Administered 2015-03-27: 20 mg via INTRAVENOUS
  Administered 2015-03-27: 10 mg via INTRAVENOUS

## 2015-03-27 MED ORDER — LIDOCAINE HCL (CARDIAC) 20 MG/ML IV SOLN
INTRAVENOUS | Status: DC | PRN
  Administered 2015-03-27: 100 mg via INTRAVENOUS

## 2015-03-27 MED ORDER — PHENOL 1.4 % MT LIQD
1.0000 | OROMUCOSAL | Status: DC | PRN
  Filled 2015-03-27: qty 177

## 2015-03-27 MED ORDER — FENTANYL CITRATE (PF) 250 MCG/5ML IJ SOLN
INTRAMUSCULAR | Status: AC
  Filled 2015-03-27: qty 5

## 2015-03-27 MED ORDER — ACETAMINOPHEN 10 MG/ML IV SOLN
INTRAVENOUS | Status: AC
  Filled 2015-03-27: qty 100

## 2015-03-27 MED ORDER — FENTANYL CITRATE (PF) 100 MCG/2ML IJ SOLN
INTRAMUSCULAR | Status: DC | PRN
  Administered 2015-03-27: 50 ug via INTRAVENOUS
  Administered 2015-03-27 (×2): 100 ug via INTRAVENOUS
  Administered 2015-03-27: 50 ug via INTRAVENOUS
  Administered 2015-03-27: 100 ug via INTRAVENOUS
  Administered 2015-03-27 (×2): 50 ug via INTRAVENOUS

## 2015-03-27 MED ORDER — KETOROLAC TROMETHAMINE 30 MG/ML IJ SOLN: 30.0000 mg | Freq: Once | INTRAMUSCULAR | Status: DC | PRN

## 2015-03-27 MED ORDER — MENTHOL 3 MG MT LOZG
1.0000 | LOZENGE | OROMUCOSAL | Status: DC | PRN
  Filled 2015-03-27: qty 9

## 2015-03-27 MED ORDER — ACETAMINOPHEN 10 MG/ML IV SOLN
INTRAVENOUS | Status: DC | PRN
  Administered 2015-03-27: 1000 mg via INTRAVENOUS

## 2015-03-27 MED ORDER — PIPERACILLIN-TAZOBACTAM 3.375 G IVPB
3.3750 g | Freq: Three times a day (TID) | INTRAVENOUS | Status: DC
  Administered 2015-03-27 – 2015-03-28 (×3): 3.375 g via INTRAVENOUS
  Filled 2015-03-27 (×4): qty 50

## 2015-03-27 MED ORDER — KCL IN DEXTROSE-NACL 20-5-0.9 MEQ/L-%-% IV SOLN
INTRAVENOUS | Status: DC
  Administered 2015-03-27 (×2): via INTRAVENOUS
  Filled 2015-03-27 (×3): qty 1000

## 2015-03-27 MED ORDER — DEXTROSE 5 % IV SOLN
3.0000 g | INTRAVENOUS | Status: AC
  Administered 2015-03-27: 3 g via INTRAVENOUS
  Filled 2015-03-27: qty 3000

## 2015-03-27 MED ORDER — HYDROMORPHONE HCL 1 MG/ML IJ SOLN
INTRAMUSCULAR | Status: DC | PRN
  Administered 2015-03-27: 0.5 mg via INTRAVENOUS

## 2015-03-27 MED ORDER — NEOSTIGMINE METHYLSULFATE 10 MG/10ML IV SOLN
INTRAVENOUS | Status: DC | PRN
  Administered 2015-03-27: 5 mg via INTRAVENOUS

## 2015-03-27 MED ORDER — HYDROMORPHONE HCL 2 MG/ML IJ SOLN
INTRAMUSCULAR | Status: AC
  Filled 2015-03-27: qty 1

## 2015-03-27 MED ORDER — LACTATED RINGERS IV SOLN
INTRAVENOUS | Status: DC | PRN
  Administered 2015-03-27 (×3): via INTRAVENOUS

## 2015-03-27 MED ORDER — 0.9 % SODIUM CHLORIDE (POUR BTL) OPTIME
TOPICAL | Status: DC | PRN
  Administered 2015-03-27: 1000 mL

## 2015-03-27 SURGICAL SUPPLY — 47 items
APL SKNCLS STERI-STRIP NONHPOA (GAUZE/BANDAGES/DRESSINGS) ×1
APPLIER CLIP 5 13 M/L LIGAMAX5 (MISCELLANEOUS) ×2
APPLIER CLIP ROT 10 11.4 M/L (STAPLE) ×2
APR CLP MED LRG 11.4X10 (STAPLE) ×1
APR CLP MED LRG 5 ANG JAW (MISCELLANEOUS) ×1
BAG SPEC RTRVL LRG 6X4 10 (ENDOMECHANICALS) ×1
BENZOIN TINCTURE PRP APPL 2/3 (GAUZE/BANDAGES/DRESSINGS) ×2 IMPLANT
CATH REDDICK CHOLANGI 4FR 50CM (CATHETERS) ×2 IMPLANT
CHLORAPREP W/TINT 26ML (MISCELLANEOUS) ×2 IMPLANT
CLIP APPLIE 5 13 M/L LIGAMAX5 (MISCELLANEOUS) ×1 IMPLANT
CLIP APPLIE ROT 10 11.4 M/L (STAPLE) IMPLANT
COVER MAYO STAND STRL (DRAPES) ×2 IMPLANT
COVER SURGICAL LIGHT HANDLE (MISCELLANEOUS) ×2 IMPLANT
DECANTER SPIKE VIAL GLASS SM (MISCELLANEOUS) ×2 IMPLANT
DISSECTOR BLUNT TIP ENDO 5MM (MISCELLANEOUS) IMPLANT
DRAIN CHANNEL 19F RND (DRAIN) ×1 IMPLANT
DRAPE C-ARM 42X120 X-RAY (DRAPES) ×2 IMPLANT
DRAPE LAPAROSCOPIC ABDOMINAL (DRAPES) ×2 IMPLANT
DRSG TEGADERM 2-3/8X2-3/4 SM (GAUZE/BANDAGES/DRESSINGS) ×4 IMPLANT
DRSG TEGADERM 4X4.75 (GAUZE/BANDAGES/DRESSINGS) ×1 IMPLANT
ELECT REM PT RETURN 9FT ADLT (ELECTROSURGICAL) ×2
ELECTRODE REM PT RTRN 9FT ADLT (ELECTROSURGICAL) ×1 IMPLANT
ENDOLOOP SUT PDS II  0 18 (SUTURE) ×2
ENDOLOOP SUT PDS II 0 18 (SUTURE) IMPLANT
EVACUATOR SILICONE 100CC (DRAIN) ×1 IMPLANT
GAUZE SPONGE 2X2 8PLY STRL LF (GAUZE/BANDAGES/DRESSINGS) ×1 IMPLANT
GLOVE ECLIPSE 8.0 STRL XLNG CF (GLOVE) ×2 IMPLANT
GLOVE INDICATOR 8.0 STRL GRN (GLOVE) ×2 IMPLANT
GOWN STRL REUS W/TWL XL LVL3 (GOWN DISPOSABLE) ×6 IMPLANT
HEMOSTAT SNOW SURGICEL 2X4 (HEMOSTASIS) ×1 IMPLANT
IV CATH 14GX2 1/4 (CATHETERS) ×2 IMPLANT
KIT BASIN OR (CUSTOM PROCEDURE TRAY) ×2 IMPLANT
LIQUID BAND (GAUZE/BANDAGES/DRESSINGS) ×1 IMPLANT
POUCH SPECIMEN RETRIEVAL 10MM (ENDOMECHANICALS) ×2 IMPLANT
SCISSORS LAP 5X35 DISP (ENDOMECHANICALS) ×2 IMPLANT
SET IRRIG TUBING LAPAROSCOPIC (IRRIGATION / IRRIGATOR) ×2 IMPLANT
SLEEVE XCEL OPT CAN 5 100 (ENDOMECHANICALS) ×4 IMPLANT
SPONGE GAUZE 2X2 STER 10/PKG (GAUZE/BANDAGES/DRESSINGS) ×1
STRIP CLOSURE SKIN 1/2X4 (GAUZE/BANDAGES/DRESSINGS) ×2 IMPLANT
SUT ETHILON 3 0 PS 1 (SUTURE) ×1 IMPLANT
SUT MNCRL AB 4-0 PS2 18 (SUTURE) ×3 IMPLANT
TOWEL OR 17X26 10 PK STRL BLUE (TOWEL DISPOSABLE) ×2 IMPLANT
TOWEL OR NON WOVEN STRL DISP B (DISPOSABLE) IMPLANT
TRAY LAPAROSCOPIC (CUSTOM PROCEDURE TRAY) ×2 IMPLANT
TROCAR BLADELESS OPT 5 100 (ENDOMECHANICALS) ×2 IMPLANT
TROCAR XCEL BLUNT TIP 100MML (ENDOMECHANICALS) ×2 IMPLANT
TROCAR XCEL NON-BLD 11X100MML (ENDOMECHANICALS) ×1 IMPLANT

## 2015-03-27 NOTE — Discharge Instructions (Addendum)
LAPAROSCOPIC SURGERY: POST OP INSTRUCTIONS  1. DIET: Follow a light bland diet the first 24 hours after arrival home, such as soup, liquids, crackers, etc.  Be sure to include lots of fluids daily.  Avoid fast food or heavy meals as your are more likely to get nauseated.  Eat a low fat the next few days after surgery.   2. Take your usually prescribed home medications unless otherwise directed. 3. PAIN CONTROL: a. Pain is best controlled by a usual combination of three different methods TOGETHER: i. Ice/Heat ii. Over the counter pain medication iii. Prescription pain medication b. Most patients will experience some swelling and bruising around the incisions.  Ice packs or heating pads (30-60 minutes up to 6 times a day) will help. Use ice for the first few days to help decrease swelling and bruising, then switch to heat to help relax tight/sore spots and speed recovery.  Some people prefer to use ice alone, heat alone, alternating between ice & heat.  Experiment to what works for you.  Swelling and bruising can take several weeks to resolve.   c. It is helpful to take an over-the-counter pain medication regularly for the first few weeks.  Choose one of the following that works best for you: i. Naproxen (Aleve, etc)  Two  tabs twice a day ii. Ibuprofen (Advil, etc) Three  tabs four times a day (every meal & bedtime) iii. Acetaminophen (Tylenol, etc) 500-650mg  four times a day (every meal & bedtime) d. A  prescription for pain medication (such as oxycodone, hydrocodone, etc) should be given to you upon discharge.  Take your pain medication as prescribed.  i. If you are having problems/concerns with the prescription medicine (does not control pain, nausea, vomiting, rash, itching, etc), please call us [phone] to see if we need to switch you to a different pain medicine that will work better for you and/or control your side effect better. ii. If you need a refill on your pain medication,  please contact your pharmacy.  They will contact our office to request authorization. Prescriptions will not be filled after 5 pm or on week-ends. 4. Avoid getting constipated.  Between the surgery and the pain medications, it is common to experience some constipation.  Increasing fluid intake and taking a fiber supplement (such as Metamucil, Citrucel, FiberCon, MiraLax, etc) 1-2 times a day regularly will usually help prevent this problem from occurring.  A mild laxative (prune juice, Milk of Magnesia, MiraLax, etc) should be taken according to package directions if there are no bowel movements after 48 hours.   5. Watch out for diarrhea.  If you have many loose bowel movements, simplify your diet to bland foods & liquids for a few days.  Stop any stool softeners and decrease your fiber supplement.  Switching to mild anti-diarrheal medications (Kayopectate, Pepto Bismol) can help.  If this worsens or does not improve, please call us. 6. Wash / shower every day.  You may shower over the dressings as they are waterproof.  Continue to shower over incision(s) after the dressing is off. 7. Remove your waterproof bandages 5 days after surgery.  You may leave the incision open to air.  You may replace a dressing/Band-Aid to cover the incision for comfort if you wish.  8. ACTIVITIES as tolerated:   a. You may resume regular (light) daily activities beginning the next day--such as daily self-care, walking, climbing stairs--gradually increasing light activities as tolerated.  No heavy lifting (over 10 pounds), straining, or  intense activities for 2 weeks. b. DO NOT PUSH THROUGH PAIN.  Let pain be your guide: If it hurts to do something, don't do it.  Pain is your body warning you to avoid that activity for another week until the pain goes down. c. You may drive when you are no longer taking prescription pain medication, you can comfortably wear a seatbelt, and you can safely maneuver your car and apply  brakes. d. Bonita QuinYou may have sexual intercourse when it is comfortable.  9. FOLLOW UP in our office a. Please call [facility] at [phone] to set up an appointment to see your surgeon in the office for a follow-up appointment approximately 1 week after your surgery if you have not heard from the office by tomorrow afternoon. b. Make sure that you call for this appointment the day you arrive home to insure a convenient appointment time. 10. IF YOU HAVE DISABILITY OR FAMILY LEAVE FORMS, BRING THEM TO THE OFFICE FOR PROCESSING.  DO NOT GIVE THEM TO YOUR DOCTOR.  11.  Return to work/school:   Full duty in 2 weeks if pain-free.  12.  Empty drain and record output twice a day.  13.  Take a probiotic each day you taking antibiotics.   WHEN TO CALL US [phone]: 1. Poor pain control 2. Reactions / problems with new medications (rash/itching, nausea, etc)  3. Fever over 101.5 F (38.5 C) 4. Inability to urinate 5. Nausea and/or vomiting 6. Worsening swelling or bruising 7. Continued bleeding from incision. 8. Increased pain, redness, or drainage from the incision   The clinic staff is available to answer your questions during regular business hours (8:30am-5pm).  Please dont hesitate to call and ask to speak to one of our nurses for clinical concerns.   If you have a medical emergency, go to the nearest emergency room or call 911.  A surgeon from [facility] is always on call at the [facility]   [facility], [place] [address], [address], [place], [place]  [address] ? MAIN: [phone]) [phone] ? TOLL FREE: [phone] ?  FAX [phone] [email or web address]

## 2015-03-27 NOTE — Progress Notes (Signed)
Dr. Abbey Chattersosenbower made aware of JP drainage amount.

## 2015-03-27 NOTE — Interval H&P Note (Signed)
History and Physical Interval Note:  03/27/2015 8:24 AM  Christopher GuileSamuel H Sultana  has presented today for surgery, with the diagnosis of Cholelithiosis, cholecystitis  The various methods of treatment have been discussed with the patient and family. After consideration of risks, benefits and other options for treatment, the patient has consented to  Procedure(s): LAPAROSCOPIC CHOLECYSTECTOMY WITH INTRAOPERATIVE CHOLANGIOGRAM (N/A) as a surgical intervention .  The patient's history has been reviewed, patient examined, no change in status, stable for surgery.  I have reviewed the patient's chart and labs.  Questions were answered to the patient's satisfaction.     Fleda Pagel JShela Commons

## 2015-03-27 NOTE — Anesthesia Postprocedure Evaluation (Signed)
Anesthesia Post Note  Patient: Christopher GuileSamuel H Beckley  Procedure(s) Performed: Procedure(s) (LRB): LAPAROSCOPIC CHOLECYSTECTOMY  (N/A)  Patient location during evaluation: PACU Anesthesia Type: General Level of consciousness: awake and alert Pain management: pain level controlled Vital Signs Assessment: post-procedure vital signs reviewed and stable Respiratory status: spontaneous breathing, nonlabored ventilation, respiratory function stable and patient connected to nasal cannula oxygen Cardiovascular status: blood pressure returned to baseline and stable Postop Assessment: no signs of nausea or vomiting Anesthetic complications: no    Last Vitals:  Filed Vitals:   03/27/15 1130 03/27/15 1145  BP: 129/69 137/74  Pulse: 72 69  Temp:  36.5 C  Resp: 12 12    Last Pain:  Filed Vitals:   03/27/15 1151  PainSc: 4                  [name]

## 2015-03-27 NOTE — Anesthesia Procedure Notes (Signed)
Procedure Name: Intubation Performed by: Kizzie FantasiaARVER, Rainbow Salman J Pre-anesthesia Checklist: Patient identified, Emergency Drugs available, Suction available, Patient being monitored and Timeout performed Patient Re-evaluated:Patient Re-evaluated prior to inductionOxygen Delivery Method: Circle system utilized Preoxygenation: Pre-oxygenation with 100% oxygen Intubation Type: IV induction Ventilation: Two handed mask ventilation required and Mask ventilation with difficulty Laryngoscope Size: Mac and 4 Grade View: Grade I Tube type: Oral Tube size: 7.5 mm Number of attempts: 1 Airway Equipment and Method: Stylet Placement Confirmation: ETT inserted through vocal cords under direct vision,  positive ETCO2,  CO2 detector and breath sounds checked- equal and bilateral Secured at: 23 cm Tube secured with: Tape Dental Injury: Teeth and Oropharynx as per pre-operative assessment

## 2015-03-27 NOTE — H&P (View-Only) (Signed)
Christopher ReddenSamuel H. Fox 02/25/2015 4:07 PM Location: [facility] Patient #: [id number] DOB: 05/17/1980 Married / Language: [place] / Race: White Male  History of Present Illness Christopher Fox(Christopher Seefeldt J. Bee Marchiano MD; 02/25/2015 5:27 PM) Patient words: New-.  The patient is a 35 year old male.   Note:He is referred by Dr. Clent Fox because of symptomatic cholelithiasis. He presented to the emergency department early February 13, 2015 because of constant ongoing severe epigastric right upper quadrant pain with nausea and vomiting that had been going on for about 7 hours. He has an elevation of his white blood cell count of 19,000 with a left shift. Liver function tests were not elevated. Ultrasound demonstrated cholelithiasis without evidence of acute cholecystitis. He seemed to be better with oral pain medication and was discharged to home. However, he states he continued to have a severe pain and went and saw Dr. Clent Fox 2 days afterwards because of the severe pain. He was significantly tender in the right upper quadrant. Over time he slowly got better. In hindsight, he's had similar episodes but not this severe. Note was that he lost 60 pounds relatively abruptly earlier this year and has gained some of it back. No family history of gallbladder disease. It took approximately 1 week for his pain to resolve.  Other Problems Christopher Fox(Christopher Fox, CMA; 02/25/2015 4:07 PM) Cholelithiasis Gastroesophageal Reflux Disease  Past Surgical History Christopher Fox(Christopher Fox, CMA; 02/25/2015 4:07 PM) Knee Surgery Right. Oral Surgery Tonsillectomy  Diagnostic Studies History Christopher Fox(Christopher Fox, CMA; 02/25/2015 4:07 PM) Colonoscopy never  Allergies Christopher Fox(Christopher Fox, CMA; 02/25/2015 4:08 PM) No Known Drug Allergies 02/25/2015  Medication History Christopher Fox(Christopher Fox, CMA; 02/25/2015 4:08 PM) No Current Medications Medications Reconciled  Social History Christopher Fox(Christopher Fox, CMA; 02/25/2015 4:07 PM) Alcohol use Occasional alcohol  use. Caffeine use Carbonated beverages, Tea. No drug use Tobacco use Former smoker.  Family History Christopher Fox(Christopher Fox, CMA; 02/25/2015 4:07 PM) Alcohol Abuse Father. Hypertension Mother.     Review of Systems Christopher Fox(Christopher Fox CMA; 02/25/2015 4:07 PM) General Not Present- Appetite Loss, Chills, Fatigue, Fever, Night Sweats, Weight Gain and Weight Loss. Skin Not Present- Change in Wart/Mole, Dryness, Hives, Jaundice, New Lesions, Non-Healing Wounds, Rash and Ulcer. HEENT Not Present- Earache, Hearing Loss, Hoarseness, Nose Bleed, Oral Ulcers, Ringing in the Ears, Seasonal Allergies, Sinus Pain, Sore Throat, Visual Disturbances, Wears glasses/contact lenses and Yellow Eyes. Respiratory Present- Snoring. Not Present- Bloody sputum, Chronic Cough, Difficulty Breathing and Wheezing. Breast Not Present- Breast Mass, Breast Pain, Nipple Discharge and Skin Changes. Cardiovascular Not Present- Chest Pain, Difficulty Breathing Lying Down, Leg Cramps, Palpitations, Rapid Heart Rate, Shortness of Breath and Swelling of Extremities. Gastrointestinal Present- Indigestion. Not Present- Abdominal Pain, Bloating, Bloody Stool, Change in Bowel Habits, Chronic diarrhea, Constipation, Difficulty Swallowing, Excessive gas, Gets full quickly at meals, Hemorrhoids, Nausea, Rectal Pain and Vomiting. Male Genitourinary Not Present- Blood in Urine, Change in Urinary Stream, Frequency, Impotence, Nocturia, Painful Urination, Urgency and Urine Leakage. Musculoskeletal Not Present- Back Pain, Joint Pain, Joint Stiffness, Muscle Pain, Muscle Weakness and Swelling of Extremities. Neurological Not Present- Decreased Memory, Fainting, Headaches, Numbness, Seizures, Tingling, Tremor, Trouble walking and Weakness. Psychiatric Not Present- Anxiety, Bipolar, Change in Sleep Pattern, Depression, Fearful and Frequent crying. Endocrine Not Present- Cold Intolerance, Excessive Hunger, Hair Changes, Heat Intolerance, Hot flashes and  New Diabetes. Hematology Not Present- Easy Bruising, Excessive bleeding, Gland problems, HIV and Persistent Infections.  Vitals (Christopher Fox CMA; 02/25/2015 4:08 PM) 02/25/2015 4:07 PM Weight: 292 lb Height: 75in Body Surface Area: 2.58 m Body Mass  Index: 36.5 kg/m  Temp.: 98.94F(Oral)  Pulse: 78 (Regular)  BP: 132/82 (Sitting, Left Arm, Standard)      Physical Exam Christopher Fox(Christopher Christopher J. Janeth Terry MD; 02/25/2015 5:29 PM)  The physical exam findings are as follows: Note:General: Overweight male in NAD. Pleasant and cooperative.  HEENT: [place]/AT, no facial masses  EYES: EOMI, no icterus  CV: RRR, no murmur, no JVD.  CHEST: Breath sounds equal and clear. Respirations nonlabored.  BREASTS: Symmetrical in size. No dominant masses, nipple discharge or suspicious skin lesions.  ABDOMEN: Soft, mild right upper quadrant tenderness, no masses.  SKIN: No jaundice .  NEUROLOGIC: Alert and oriented, answers questions appropriately, normal gait and station.  PSYCHIATRIC: Normal mood, affect , and behavior.    Assessment & Plan Christopher Fox(Christopher Christopher J. Delos Klich MD; 02/25/2015 5:01 PM)  SYMPTOMATIC CHOLELITHIASIS (K80.20) Impression: I'm concerned with the significant elevation of the white blood cell count and a prolonged pain he had that he had an element of acute cholecystitis which is slowly resolving. He ended up seeing Dr. Clent Fox 2 days after his emergency department visit and had to be given more pain medication. He was also very tender in the right upper quadrant at that time. He currently is asymptomatic.  Plan: I suggested waiting 6 weeks from November 30 as I think he did have some acute cholecystitis and this will allow the induration to go down and make the operation potentially less complicated. I discussed this with him and his wife at length. Given the fact that he is afebrile and asymptomatic at this time, I do not think he needs antibiotics. I have explained the procedure, risks, and  aftercare of cholecystectomy. Risks include but are not limited to bleeding, infection, wound problems, anesthesia, diarrhea, bile leak, injury to common bile duct/liver/intestine. He seems to understand and agrees with the plan.  Avel Peaceodd Sarinity Dicicco, MD

## 2015-03-27 NOTE — Op Note (Addendum)
OPERATIVE NOTE-LAPAROSCOPIC CHOLECYSTECTOMY  Preoperative diagnosis:  Symptomatic cholelithiasis  Postoperative diagnosis:  Acute and chronic cholecystitis with cholelithiasis  Procedure: Laparoscopic cholecystectomy   Surgeon: Avel Peace, M.D.  Asst.:  Dr. Jaclynn Guarneri  Anesthesia: General  Indication:   This is a 35 year old male who had severe right upper quadrant pain and was seen in the emergency department On 02/12/2015 and then discharged. He had a leukocytosis at that time and prolonged pain. He saw his primary care physician 2 days after his emergency department visit because of the pain had given more pain medication. He saw me in 02/25/2015 and was asymptomatic. There is concern that he may have had acute cholecystitis that resolved. He now presents for cholecystectomy. His preoperative liver function tests are normal.  12 days ago, his white blood cell count was 10,900 which was down from 19,000 and he was seen in the emergency department.  Technique: He was brought to the operating room, placed supine on the operating table, and a general anesthetic was administered. The hair on the abdominal wall was clipped as was necessary. The abdominal wall was then sterilely prepped and draped. Local anesthetic (Marcaine) was infiltrated in the subumbilical region. A small subumbilical incision was made through the skin, subcutaneous tissue, fascia, and peritoneum entering the peritoneal cavity under direct vision. A pursestring suture of 0 Vicryl was placed around the edges of the fascia. A Hassan trocar was introduced into the peritoneal cavity and a pneumoperitoneum was created by insufflation of carbon dioxide gas. The laparoscope was introduced into the trocar and no underlying bleeding or organ injury was noted. He/She was then placed in the reverse Trendelenburg position with the right side tilted slightly up.  Three 5 mm trocars were then placed into the abdominal cavity under  laparoscopic vision. One in the epigastric area, and 2 in the right upper quadrant area. The gallbladder was visualized And dense omental adhesions were noted to acutely inflamed thickened gallbladder. Fatty infiltration changes of the liver were noted. Using electrocautery, blunt dissection, and hydrodissection, the adhesions were freed from the gallbladder.  The fundus of the gallbladder was then grasped and retracted toward the right shoulder. I then used blunt dissection and electrocautery to mobilize and inflamed, firm infundibulum. The cystic duct was identified. It appeared to be somewhat dilated, firm, and inflamed. It also appeared to be short. I upsized the epigastric trocar to an 11 mm trocar. With dissection on the gallbladder, I was able to create a window around it. I identified the cystic artery and the triangle of Calot.  This was isolated, clipped, and divided. This allowed me to see the critical view. I then made a small incision in the cystic duct at its junction with the gallbladder. Purulent yellow fluid drained out of the gallbladder. I note the cystic duct and extracted the stone which I retrieved. Because of the shortness and inflammation the cystic duct, I was not able to perform a cholangiogram. I subsequently clipped the cystic duct once then clipped the neck of the gallbladder. The purulent fluid leak. I irrigated out the area and evacuated the purulent fluid. I saw one gallstone in the purulent fluid which was removed. I then divided the cystic duct. I placed a PDS Endoloop on the biliary side of the cystic duct. There is no evidence of bile leak from the cystic duct stump.  I then dissected the gallbladder free from the liver. It was firm and indurated and there is no obvious plane between the gallbladder  and liver. There is some more spillage of purulent fluid from the gallbladder. This was irrigated and evacuated. Once the gallbladder had been dissected free from the liver, it was  placed in the retrieval bag.  The gallbladder fossa and perihepatic areas were then copiously irrigated and inspected. There is no evidence of bleeding or bile leak. A piece of Surgicel was placed in the gallbladder fossa. A size 19 round Blake drain was then placed into the peritoneal cavity through the epigastric trocar. It was brought out the lateral 5 mm right upper quadrant trocar site and anchored to the skin with nylon suture. I then placed a drain in the dependent area of the gallbladder fossa.  The gallbladder in the retrieval bag was then removed through the subumbilical port incision. The subumbilical fascial defect was closed under laparoscopic vision by tying down the pursestring suture.  Under real-time fluoroscopy, dilute contrast was injected into the cystic duct.  The common hepatic duct, the right and left hepatic ducts, and the common duct were all visualized. Contrast drained into the duodenum without obvious evidence of any obstructing ductal lesion. The final report is pending the Radiologist's interpretation.  The cholangiocatheter was removed, the cystic duct was clipped 3 times on the biliary side, and then the cystic duct was divided sharply. No bile leak was noted from the cystic duct stump.  The cystic artery was then clipped and divided. Following this the gallbladder was dissected free from the liver using electrocautery. The gallbladder was then placed in a retrieval bag and removed from the abdominal cavity through the subumbilical incision.  The gallbladder fossa was inspected, irrigated, and bleeding was controlled with electrocautery. Inspection showed that hemostasis was adequate and there was no evidence of bile leak.  The irrigation fluid was evacuated as much as possible.  The subumbilical trocar was removed and the fascial defect was closed by tightening and tying down the pursestring suture under laparoscopic vision.  The remaining trocars were removed and the  pneumoperitoneum was released. The skin incisions were closed with 4-0 Monocryl subcuticular stitches. Steri-Strips and sterile dressings were applied.  The procedure was well-tolerated without any apparent complications. He was taken to the recovery room in satisfactory condition. He will be kept overnight for intravenous antibiotics and so he can be taught drain care.

## 2015-03-27 NOTE — Transfer of Care (Signed)
Immediate Anesthesia Transfer of Care Note  Patient: Christopher Fox  Procedure(s) Performed: Procedure(s): LAPAROSCOPIC CHOLECYSTECTOMY  (N/A)  Patient Location: PACU  Anesthesia Type:General  Level of Consciousness: sedated, patient cooperative and responds to stimulation  Airway & Oxygen Therapy: Patient Spontanous Breathing and Patient connected to face mask oxygen  Post-op Assessment: Report given to RN and Post -op Vital signs reviewed and stable  Post vital signs: Reviewed and stable  Last Vitals:  Filed Vitals:   03/27/15 0637  BP: 130/83  Pulse: 93  Temp: 36.2 C  Resp: 18    Complications: No apparent anesthesia complications

## 2015-03-27 NOTE — Anesthesia Preprocedure Evaluation (Addendum)
Anesthesia Evaluation  Patient identified by MRN, date of birth, ID band Patient awake    Reviewed: Allergy & Precautions, NPO status , Patient's Chart, lab work & pertinent test results  Airway Mallampati: III  TM Distance: >3 FB Neck ROM: Full    Dental no notable dental hx. (+) Dental Advisory Given   Pulmonary neg pulmonary ROS, former smoker,    Pulmonary exam normal breath sounds clear to auscultation       Cardiovascular negative cardio ROS Normal cardiovascular exam Rhythm:Regular Rate:Normal     Neuro/Psych negative neurological ROS  negative psych ROS   GI/Hepatic negative GI ROS, Neg liver ROS,   Endo/Other  obesity  Renal/GU negative Renal ROS  negative genitourinary   Musculoskeletal negative musculoskeletal ROS (+)   Abdominal   Peds negative pediatric ROS (+)  Hematology negative hematology ROS (+)   Anesthesia Other Findings   Reproductive/Obstetrics negative OB ROS                            Anesthesia Physical Anesthesia Plan  ASA: II  Anesthesia Plan: General   Post-op Pain Management:    Induction: Intravenous  Airway Management Planned: Oral ETT  Additional Equipment:   Intra-op Plan:   Post-operative Plan: Extubation in OR  Informed Consent: I have reviewed the patients History and Physical, chart, labs and discussed the procedure including the risks, benefits and alternatives for the proposed anesthesia with the patient or authorized representative who has indicated his/her understanding and acceptance.   Dental advisory given  Plan Discussed with: CRNA and Surgeon  Anesthesia Plan Comments:         Anesthesia Quick Evaluation

## 2015-03-28 DIAGNOSIS — K8012 Calculus of gallbladder with acute and chronic cholecystitis without obstruction: Secondary | ICD-10-CM | POA: Diagnosis not present

## 2015-03-28 MED ORDER — OXYCODONE HCL 5 MG PO TABS: 5.0000 mg | ORAL_TABLET | ORAL | Status: DC | PRN

## 2015-03-28 MED ORDER — AMOXICILLIN-POT CLAVULANATE 875-125 MG PO TABS: 1.0000 | ORAL_TABLET | Freq: Two times a day (BID) | ORAL | Status: DC

## 2015-03-28 NOTE — Progress Notes (Signed)
1 Day Post-Op  Subjective: Sore otherwise doing okay. Operative findings discussed.   Objective: Vital signs in last 24 hours: Temp:  [97.5 F (36.4 C)-98.4 F (36.9 C)] 97.8 F (36.6 C) (01/12 0518) Pulse Rate:  [68-97] 86 (01/12 0518) Resp:  [10-16] 16 (01/12 0518) BP: (129-164)/(69-93) 130/75 mmHg (01/12 0518) SpO2:  [92 %-99 %] 93 % (01/12 0518) Weight:  [131.543 kg (290 lb)] 131.543 kg (290 lb) (01/11 1205) Last BM Date: 03/27/15  Intake/Output from previous day: 01/11 0701 - 01/12 0700 In: 4468.3 [P.O.:240; I.V.:4078.3; IV Piggyback:150] Out: 1917 [Urine:1425; Drains:492] Intake/Output this shift:    PE: General- In NAD Abdomen-soft, dressings dry, serosanguinous drain output  Lab Results:  No results for input(s): WBC, HGB, HCT, PLT in the last 72 hours. BMET No results for input(s): NA, K, CL, CO2, GLUCOSE, BUN, CREATININE, CALCIUM in the last 72 hours. PT/INR No results for input(s): LABPROT, INR in the last 72 hours. Comprehensive Metabolic Panel:    Component Value Date/Time   NA 139 03/15/2015 1125   NA 142 02/12/2015 2146   K 4.1 03/15/2015 1125   K 4.2 02/12/2015 2146   CL 105 03/15/2015 1125   CL 100* 02/12/2015 2146   CO2 25 03/15/2015 1125   CO2 31 02/12/2015 2146   BUN 14 03/15/2015 1125   BUN 14 02/12/2015 2146   CREATININE 0.75 03/15/2015 1125   CREATININE 1.01 02/12/2015 2146   GLUCOSE 91 03/15/2015 1125   GLUCOSE 161* 02/12/2015 2146   CALCIUM 9.1 03/15/2015 1125   CALCIUM 9.8 02/12/2015 2146   AST 25 03/15/2015 1125   AST 33 02/12/2015 2146   ALT 44 03/15/2015 1125   ALT 53 02/12/2015 2146   ALKPHOS 73 03/15/2015 1125   ALKPHOS 78 02/12/2015 2146   BILITOT 0.9 03/15/2015 1125   BILITOT 0.6 02/12/2015 2146   PROT 7.9 03/15/2015 1125   PROT 8.0 02/12/2015 2146   ALBUMIN 4.8 03/15/2015 1125   ALBUMIN 4.7 02/12/2015 2146     Studies/Results: No results found.  Anti-infectives: Anti-infectives    Start     Dose/Rate Route  Frequency Ordered Stop   03/27/15 1300  piperacillin-tazobactam (ZOSYN) IVPB 3.375 g     3.375 g 12.5 mL/hr over 240 Minutes Intravenous 3 times per day 03/27/15 1226     03/27/15 0830  ceFAZolin (ANCEF) 3 g in dextrose 5 % 50 mL IVPB     3 g 130 mL/hr over 30 Minutes Intravenous On call to O.R. 03/27/15 0816 03/27/15 0843      Assessment Active Problems:   Acute cholecystitis with chronic cholecystitis s/p lap chole 03/27/15-Doing okay this AM.  He also had fatty liver changes which I discussed with him and his wife.      Plan: Discharge today on oral abxs and with drain.  Follow up next week for drain removal.   Charmian Forbis J 03/28/2015

## 2015-03-28 NOTE — Progress Notes (Signed)
Assessment unchanged. Pt, wife and mother verbalized understanding of dc instructions including follow up care, bulb drain care and when to call the doctor. Wife and mother demonstrated understanding of emptying and recharging bulb drain. Scripts x 2 given as provided by MD. Discharged via wc to front entrance to meet awaiting vehicle to carry home. Accompanied by NT, wife and mother.

## 2015-05-13 ENCOUNTER — Other Ambulatory Visit (INDEPENDENT_AMBULATORY_CARE_PROVIDER_SITE_OTHER): Payer: Commercial Managed Care - HMO

## 2015-05-13 DIAGNOSIS — Z0000 Encounter for general adult medical examination without abnormal findings: Secondary | ICD-10-CM | POA: Diagnosis not present

## 2015-05-13 LAB — CBC WITH DIFFERENTIAL/PLATELET
BASOS PCT: 0.4 % (ref 0.0–3.0)
Basophils Absolute: 0 10*3/uL (ref 0.0–0.1)
EOS ABS: 0.3 10*3/uL (ref 0.0–0.7)
Eosinophils Relative: 3.2 % (ref 0.0–5.0)
HEMATOCRIT: 44.9 % (ref 39.0–52.0)
Hemoglobin: 15.3 g/dL (ref 13.0–17.0)
LYMPHS PCT: 21.5 % (ref 12.0–46.0)
Lymphs Abs: 1.8 10*3/uL (ref 0.7–4.0)
MCHC: 34.1 g/dL (ref 30.0–36.0)
MCV: 79.5 fl (ref 78.0–100.0)
MONOS PCT: 5.5 % (ref 3.0–12.0)
Monocytes Absolute: 0.5 10*3/uL (ref 0.1–1.0)
NEUTROS ABS: 5.7 10*3/uL (ref 1.4–7.7)
Neutrophils Relative %: 69.4 % (ref 43.0–77.0)
PLATELETS: 241 10*3/uL (ref 150.0–400.0)
RBC: 5.65 Mil/uL (ref 4.22–5.81)
RDW: 13.6 % (ref 11.5–15.5)
WBC: 8.3 10*3/uL (ref 4.0–10.5)

## 2015-05-13 LAB — POC URINALSYSI DIPSTICK (AUTOMATED)
BILIRUBIN UA: NEGATIVE
Blood, UA: NEGATIVE
GLUCOSE UA: NEGATIVE
Ketones, UA: NEGATIVE
Leukocytes, UA: NEGATIVE
Nitrite, UA: NEGATIVE
PH UA: 5.5
Protein, UA: NEGATIVE
SPEC GRAV UA: 1.025
UROBILINOGEN UA: 0.2

## 2015-05-13 LAB — HEPATIC FUNCTION PANEL
ALBUMIN: 4.8 g/dL (ref 3.5–5.2)
ALT: 25 U/L (ref 0–53)
AST: 14 U/L (ref 0–37)
Alkaline Phosphatase: 77 U/L (ref 39–117)
Bilirubin, Direct: 0.2 mg/dL (ref 0.0–0.3)
TOTAL PROTEIN: 7.3 g/dL (ref 6.0–8.3)
Total Bilirubin: 0.9 mg/dL (ref 0.2–1.2)

## 2015-05-13 LAB — BASIC METABOLIC PANEL
BUN: 15 mg/dL (ref 6–23)
CALCIUM: 9.7 mg/dL (ref 8.4–10.5)
CO2: 29 meq/L (ref 19–32)
CREATININE: 0.74 mg/dL (ref 0.40–1.50)
Chloride: 105 mEq/L (ref 96–112)
GFR: 128.58 mL/min (ref >60.00)
GLUCOSE: 93 mg/dL (ref 70–99)
Potassium: 4.6 mEq/L (ref 3.5–5.1)
SODIUM: 141 meq/L (ref 135–145)

## 2015-05-13 LAB — TSH: TSH: 0.61 u[IU]/mL (ref 0.35–4.50)

## 2015-05-13 LAB — LIPID PANEL
CHOLESTEROL: 127 mg/dL (ref 0–200)
HDL: 31.8 mg/dL — ABNORMAL LOW (ref >39.00)
LDL Cholesterol: 78 mg/dL (ref 0–99)
NonHDL: 95.3
TRIGLYCERIDES: 89 mg/dL (ref 0.0–149.0)
Total CHOL/HDL Ratio: 4
VLDL: 17.8 mg/dL (ref 0.0–40.0)

## 2015-05-20 ENCOUNTER — Ambulatory Visit (INDEPENDENT_AMBULATORY_CARE_PROVIDER_SITE_OTHER): Payer: Commercial Managed Care - HMO | Admitting: Family Medicine

## 2015-05-20 ENCOUNTER — Encounter: Payer: Self-pay | Admitting: Family Medicine

## 2015-05-20 VITALS — BP 105/59 | HR 62 | Temp 97.8°F | Ht 75.0 in | Wt 274.0 lb

## 2015-05-20 DIAGNOSIS — Z0000 Encounter for general adult medical examination without abnormal findings: Secondary | ICD-10-CM

## 2015-05-20 NOTE — Progress Notes (Signed)
   Subjective:    Patient ID: Christopher GuileSamuel H Dredge, male    DOB: 08/17/1980, [age] y.o.   MRN: [id number]  HPI 35 yr old male for a cpx. He feels well. He has recovered completely from gall bladder surgery. He is trying to eat a more healthy diet and he has lost about 30 lbs.   Review of Systems  Constitutional: Negative.   HENT: Negative.   Eyes: Negative.   Respiratory: Negative.   Cardiovascular: Negative.   Gastrointestinal: Negative.   Genitourinary: Negative.   Musculoskeletal: Negative.   Skin: Negative.   Neurological: Negative.   Psychiatric/Behavioral: Negative.        Objective:   Physical Exam  Constitutional: He is oriented to person, place, and time. He appears well-developed and well-nourished. No distress.  HENT:  Head: Normocephalic and atraumatic.  Right Ear: External ear normal.  Left Ear: External ear normal.  Nose: Nose normal.  Mouth/Throat: Oropharynx is clear and moist. No oropharyngeal exudate.  Eyes: Conjunctivae and EOM are normal. Pupils are equal, round, and reactive to light. Right eye exhibits no discharge. Left eye exhibits no discharge. No scleral icterus.  Neck: Neck supple. No JVD present. No tracheal deviation present. No thyromegaly present.  Cardiovascular: Normal rate, regular rhythm, normal heart sounds and intact distal pulses.  Exam reveals no gallop and no friction rub.   No murmur heard. Pulmonary/Chest: Effort normal and breath sounds normal. No respiratory distress. He has no wheezes. He has no rales. He exhibits no tenderness.  Abdominal: Soft. Bowel sounds are normal. He exhibits no distension and no mass. There is no tenderness. There is no rebound and no guarding.  Genitourinary: Rectum normal, prostate normal and penis normal. Guaiac negative stool. No penile tenderness.  Musculoskeletal: Normal range of motion. He exhibits no edema or tenderness.  Lymphadenopathy:    He has no cervical adenopathy.  Neurological: He is alert and  oriented to person, place, and time. He has normal reflexes. No cranial nerve deficit. He exhibits normal muscle tone. Coordination normal.  Skin: Skin is warm and dry. No rash noted. He is not diaphoretic. No erythema. No pallor.  Psychiatric: He has a normal mood and affect. His behavior is normal. Judgment and thought content normal.          Assessment & Plan:  Well exam. We discussed diet and exercise.

## 2015-05-20 NOTE — Progress Notes (Signed)
Pre visit review using our clinic review tool, if applicable. No additional management support is needed unless otherwise documented below in the visit note. 

## 2016-07-06 DIAGNOSIS — J029 Acute pharyngitis, unspecified: Secondary | ICD-10-CM | POA: Diagnosis not present

## 2016-07-06 DIAGNOSIS — K591 Functional diarrhea: Secondary | ICD-10-CM | POA: Diagnosis not present

## 2016-08-06 IMAGING — US US ABDOMEN LIMITED
1 series · 14 of 25 positions shown · non-contrast
Comparison: None.

CLINICAL DATA: 34-year-old male with right upper quadrant abdominal
pain

EXAM:
US ABDOMEN LIMITED - RIGHT UPPER QUADRANT

[Series 1: us abdomen limited · 0.34mm/px · 14 of 46 slices shown]
[im 1/46]
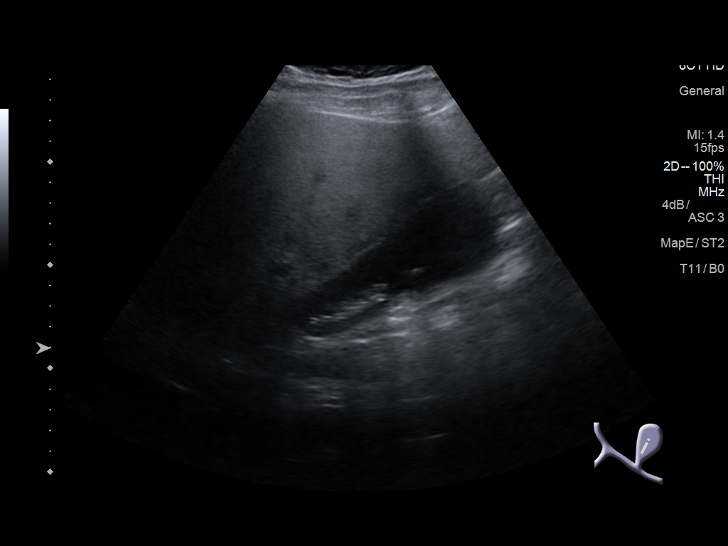
[im 4/46]
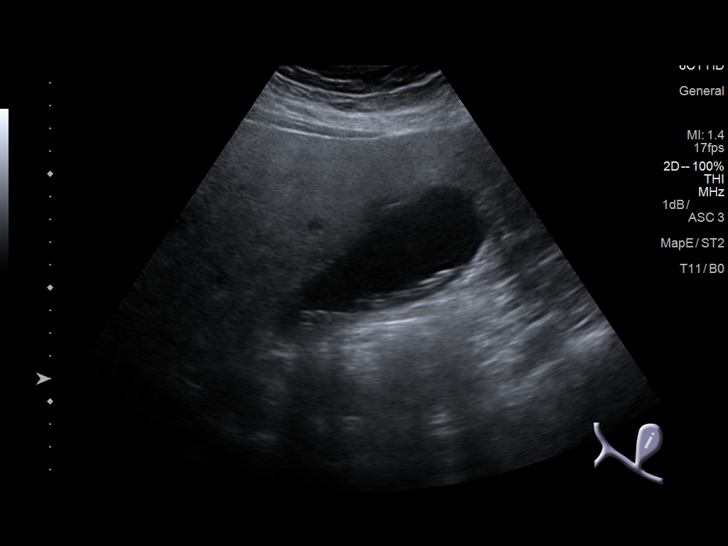
[im 8/46]
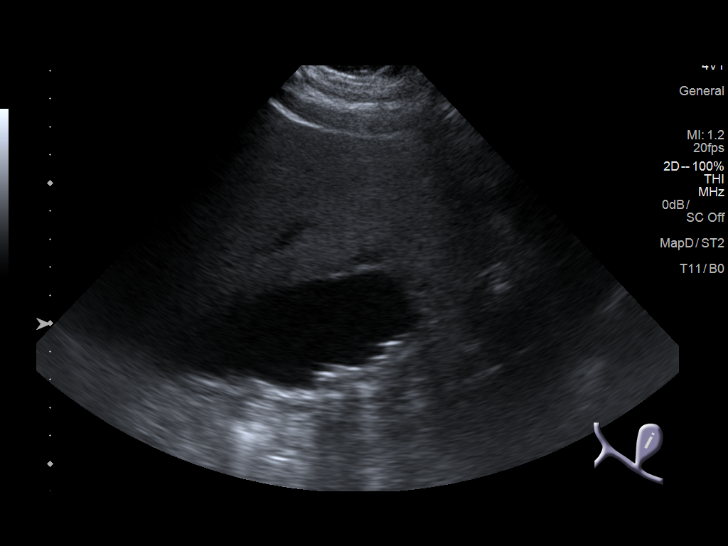
[im 12/46]
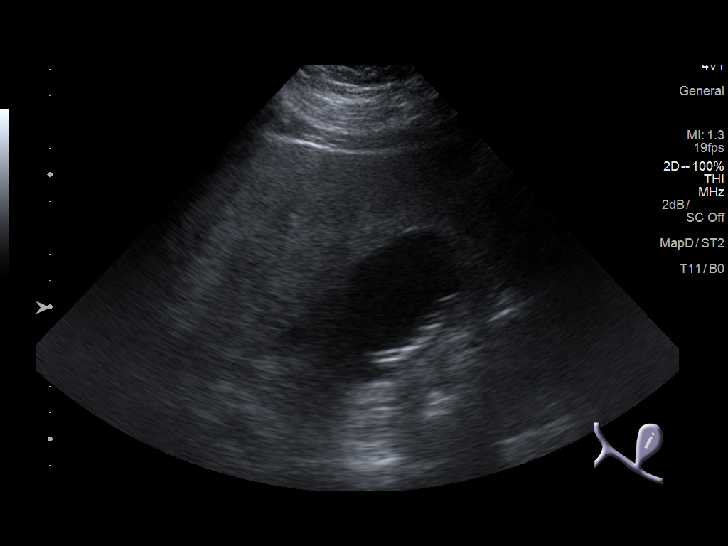
[im 16/46]
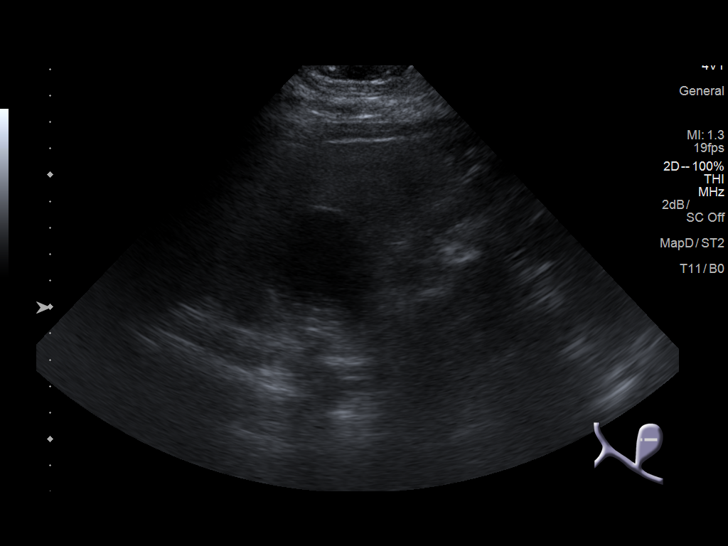
[im 17/46]
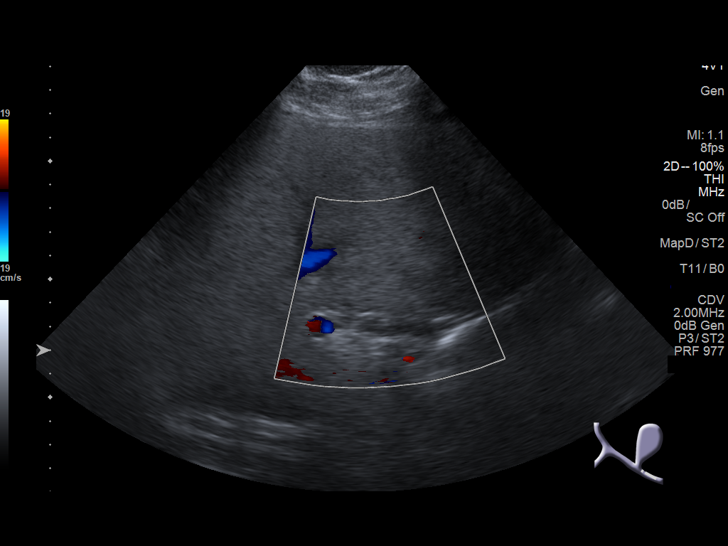
[im 21/46]
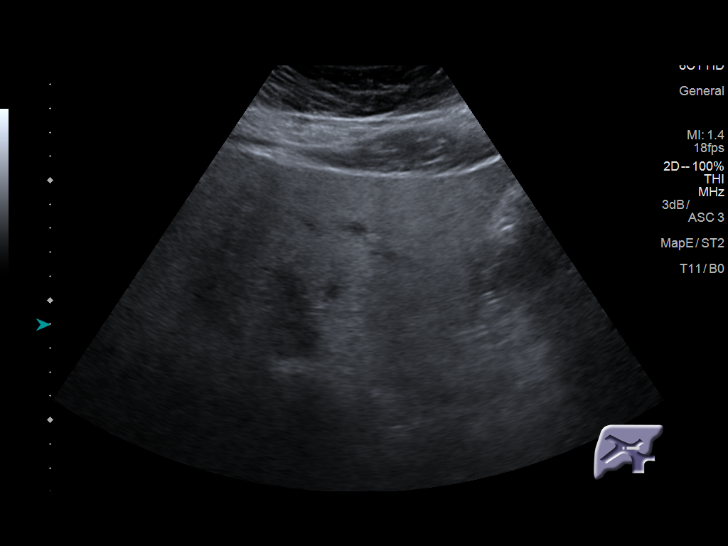
[im 25/46]
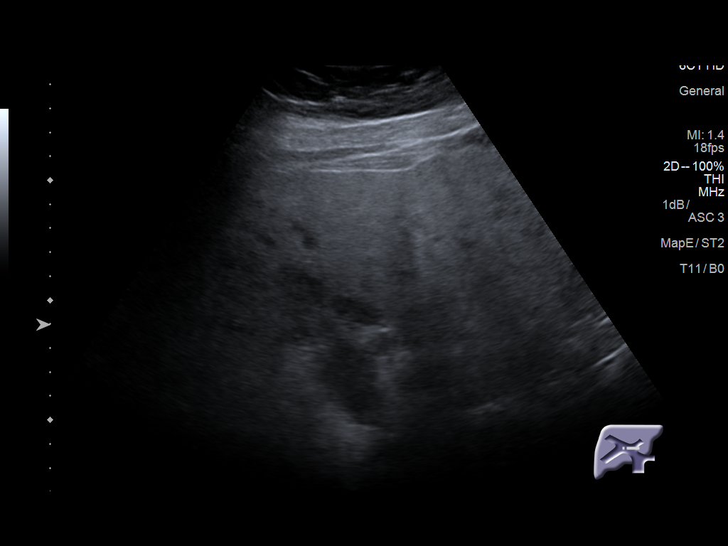
[im 29/46]
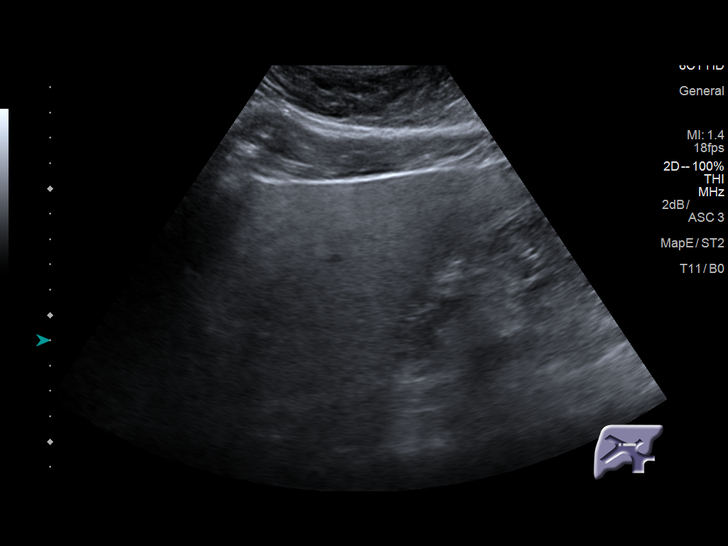
[im 31/46]
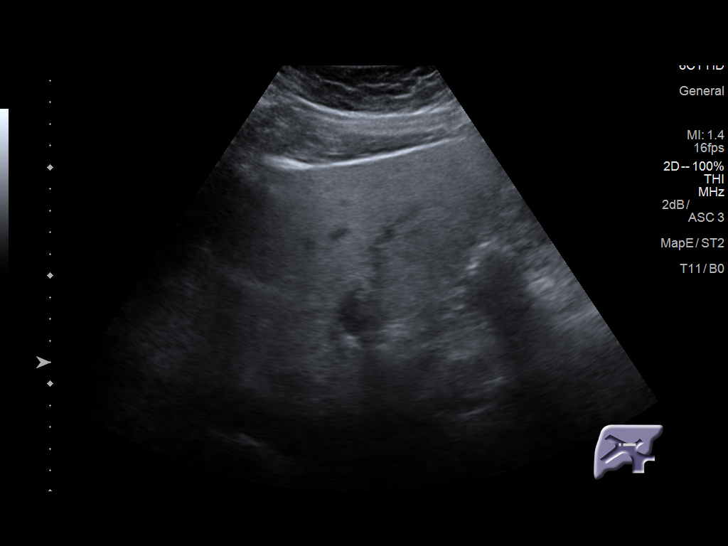
[im 34/46]
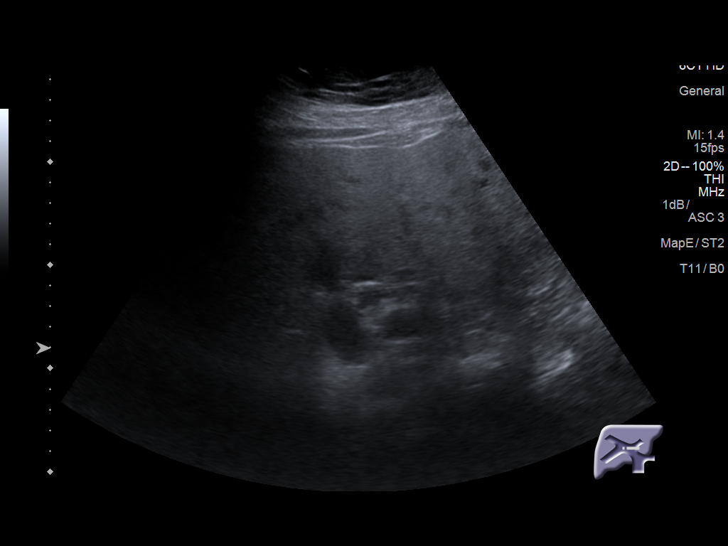
[im 38/46]
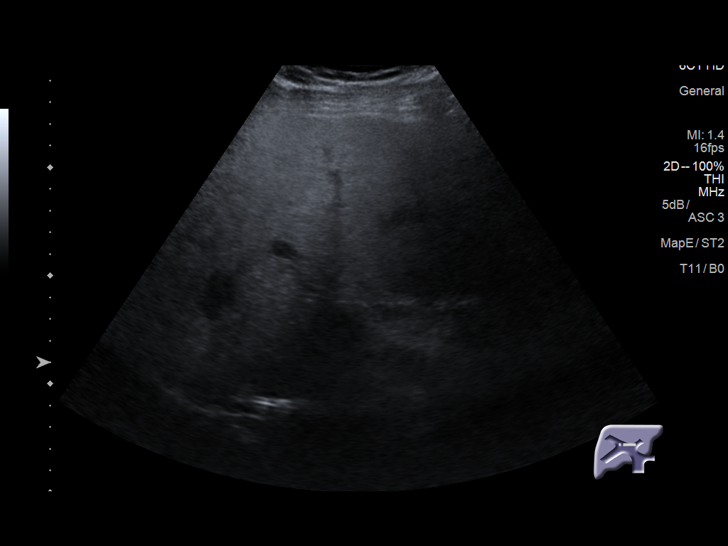
[im 42/46]
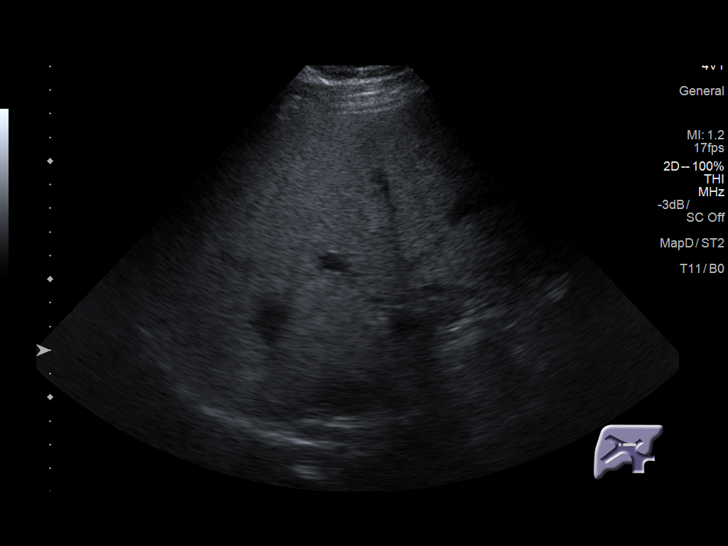
[im 46/46]
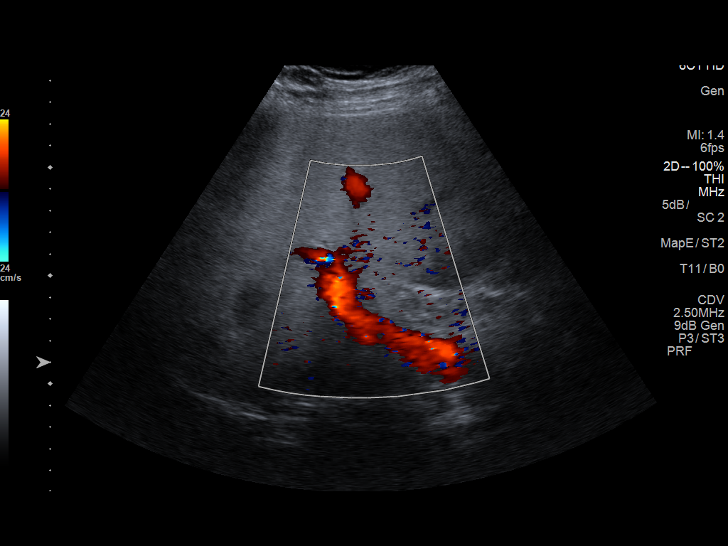

[14 of 25 positions shown; findings below may reference images not displayed]

FINDINGS: Gallbladder:

There multiple stones within the gallbladder. There is no
gallbladder wall thickening or pericholecystic fluid. Hypoechoic
areas in the liver adjacent to the gallbladder most compatible with
focal fatty sparring. There is no sonographic Kizer spine.

Common bile duct:

Diameter: 5 mm

Liver:

Diffuse increased echogenicity compatible with fatty infiltration.
IMPRESSION: Cholelithiasis without sonographic evidence acute cholecystitis. A
hepatobiliary scintigraphy may provide better evaluation of the
gallbladder if an acute cholecystitis is clinically suspected.

Diffuse hepatic steatosis.

## 2017-05-31 ENCOUNTER — Ambulatory Visit: Payer: Self-pay

## 2017-05-31 NOTE — Telephone Encounter (Signed)
Patient called for c/o "chest pain." He says "it's more tightness than pain, It started about 1-2 months ago feeling something in my stomach. I went to the work nurse and she told me to take prilosec, because I was describing it like gas pains. I took it, but the same symptoms are there. It's a tightness almost like a pulled muscle in my chest, but I haven't done anything. It's to the right under my chest, sometimes go to my back. When I'm sleeping on my right side, I feel it more. Then I turn to my left side and there is nothing there. When I get up in the morning, it's about a 5-6 tightness, but when I get to moving around and at work, it's about a 1-3 to none. I do dip smokeless tobacco and I wonder if that could be the problem." I ask is he having any other symptoms, he says "no other symptoms." According to protocol, see PCP within 24 hours, appointment made for tomorrow at 0845 with [name], care advice given, patient verbalized understanding.   Reason for Disposition . [1] Chest pain lasting <= 5 minutes AND [2] NO chest pain or cardiac symptoms now(Exceptions: pains lasting a few seconds)  Answer Assessment - Initial Assessment Questions 1. LOCATION: "Where does it hurt?"       Feel it in chest, but not painful, like a tightness towards under right chest  2. RADIATION: "Does the pain go anywhere else?" (e.g., into neck, jaw, arms, back)     To back every once in a while; roll on right side feel tightness then none on left side when roll. 3. ONSET: "When did the chest pain begin?" (Minutes, hours or days)      1-2 months 4. PATTERN "Does the pain come and go, or has it been constant since it started?"  "Does it get worse with exertion?"      Comes and goes; more tightness when get up in morning, lift something 5. DURATION: "How long does it last" (e.g., seconds, minutes, hours)     Tightness comes and goes; no pain 6. SEVERITY: "How bad is the pain?"  (e.g., Scale 1-10; mild, moderate, or  severe)    - MILD (1-3): doesn't interfere with normal activities     - MODERATE (4-7): interferes with normal activities or awakens from sleep    - SEVERE (8-10): excruciating pain, unable to do any normal activities       Mild 7. CARDIAC RISK FACTORS: "Do you have any history of heart problems or risk factors for heart disease?" (e.g., prior heart attack, angina; high blood pressure, diabetes, being overweight, high cholesterol, smoking, or strong family history of heart disease)     Overweight, smokeless tobacco, no strong family history 8. PULMONARY RISK FACTORS: "Do you have any history of lung disease?"  (e.g., blood clots in lung, asthma, emphysema, birth control pills)     No 9. CAUSE: "What do you think is causing the chest pain?"     Sometimes it feels like gas, maybe my dip tobacco, I don't know 10. OTHER SYMPTOMS: "Do you have any other symptoms?" (e.g., dizziness, nausea, vomiting, sweating, fever, difficulty breathing, cough)       No 11. PREGNANCY: "Is there any chance you are pregnant?" "When was your last menstrual period?"       N/A  Protocols used: CHEST PAIN-A-AH

## 2017-06-01 ENCOUNTER — Ambulatory Visit (INDEPENDENT_AMBULATORY_CARE_PROVIDER_SITE_OTHER): Payer: 59 | Admitting: Family Medicine

## 2017-06-01 ENCOUNTER — Encounter: Payer: Self-pay | Admitting: Family Medicine

## 2017-06-01 VITALS — BP 130/70 | HR 79 | Temp 97.8°F | Wt 289.4 lb

## 2017-06-01 DIAGNOSIS — K219 Gastro-esophageal reflux disease without esophagitis: Secondary | ICD-10-CM | POA: Diagnosis not present

## 2017-06-01 MED ORDER — OMEPRAZOLE 40 MG PO CPDR: 40.0000 mg | DELAYED_RELEASE_CAPSULE | Freq: Every day | ORAL | 3 refills | Status: DC

## 2017-06-01 NOTE — Progress Notes (Signed)
   Subjective:    Patient ID: Christopher Fox, male    DOB: 05/30/1980, 37 y.o.   MRN: [id number]  HPI Here for several weeks of intermittent epigastric fullness and pressure. No nausea or difficulty swallowing. He has little actual heartburn. He is taking Prilosec OTC daily. BMs are normal. No excessive belching or flatus.    Review of Systems  Constitutional: Negative.   Respiratory: Negative.   Cardiovascular: Negative.   Gastrointestinal: Negative.        Objective:   Physical Exam  Constitutional: He appears well-developed and well-nourished.  Cardiovascular: Normal rate, regular rhythm, normal heart sounds and intact distal pulses.  Pulmonary/Chest: Effort normal and breath sounds normal. No respiratory distress. He has no wheezes. He has no rales. He exhibits no tenderness.  Abdominal: Soft. Bowel sounds are normal. He exhibits no distension and no mass. There is no tenderness. There is no rebound and no guarding.          Assessment & Plan:  Probable GERD. He will increase the Prilosec to 40 mg daily. Add a probiotic like Align daily. Recheck prn.  Gershon CraneStephen Fry, MD

## 2017-10-04 ENCOUNTER — Other Ambulatory Visit: Payer: Self-pay | Admitting: Family Medicine

## 2017-11-29 DIAGNOSIS — M238X1 Other internal derangements of right knee: Secondary | ICD-10-CM | POA: Diagnosis not present

## 2018-03-23 DIAGNOSIS — Z719 Counseling, unspecified: Secondary | ICD-10-CM | POA: Diagnosis not present

## 2018-05-30 ENCOUNTER — Encounter: Payer: Self-pay | Admitting: Family Medicine

## 2018-05-30 ENCOUNTER — Other Ambulatory Visit: Payer: Self-pay

## 2018-05-30 ENCOUNTER — Ambulatory Visit (INDEPENDENT_AMBULATORY_CARE_PROVIDER_SITE_OTHER): Payer: 59 | Admitting: Family Medicine

## 2018-05-30 VITALS — BP 128/82 | HR 74 | Temp 97.9°F | Ht 76.0 in | Wt 264.0 lb

## 2018-05-30 DIAGNOSIS — Z0000 Encounter for general adult medical examination without abnormal findings: Secondary | ICD-10-CM

## 2018-05-30 LAB — CBC WITH DIFFERENTIAL/PLATELET
BASOS ABS: 0 10*3/uL (ref 0.0–0.1)
Basophils Relative: 0.3 % (ref 0.0–3.0)
Eosinophils Absolute: 0.2 10*3/uL (ref 0.0–0.7)
Eosinophils Relative: 2.5 % (ref 0.0–5.0)
HEMATOCRIT: 44.3 % (ref 39.0–52.0)
HEMOGLOBIN: 15.1 g/dL (ref 13.0–17.0)
Lymphocytes Relative: 13.6 % (ref 12.0–46.0)
Lymphs Abs: 1.2 10*3/uL (ref 0.7–4.0)
MCHC: 34.1 g/dL (ref 30.0–36.0)
MCV: 81.1 fl (ref 78.0–100.0)
MONO ABS: 0.6 10*3/uL (ref 0.1–1.0)
Monocytes Relative: 6.6 % (ref 3.0–12.0)
NEUTROS PCT: 77 % (ref 43.0–77.0)
Neutro Abs: 6.6 10*3/uL (ref 1.4–7.7)
Platelets: 235 10*3/uL (ref 150.0–400.0)
RBC: 5.46 Mil/uL (ref 4.22–5.81)
RDW: 13.7 % (ref 11.5–15.5)
WBC: 8.6 10*3/uL (ref 4.0–10.5)

## 2018-05-30 LAB — HEPATIC FUNCTION PANEL
ALBUMIN: 4.8 g/dL (ref 3.5–5.2)
ALT: 27 U/L (ref 0–53)
AST: 17 U/L (ref 0–37)
Alkaline Phosphatase: 88 U/L (ref 39–117)
BILIRUBIN DIRECT: 0.2 mg/dL (ref 0.0–0.3)
TOTAL PROTEIN: 7.2 g/dL (ref 6.0–8.3)
Total Bilirubin: 0.7 mg/dL (ref 0.2–1.2)

## 2018-05-30 LAB — POC URINALSYSI DIPSTICK (AUTOMATED)
Bilirubin, UA: NEGATIVE
Glucose, UA: NEGATIVE
Ketones, UA: NEGATIVE
Leukocytes, UA: NEGATIVE
Nitrite, UA: NEGATIVE
PROTEIN UA: NEGATIVE
SPEC GRAV UA: 1.01 (ref 1.010–1.025)
UROBILINOGEN UA: 0.2 U/dL
pH, UA: 6 (ref 5.0–8.0)

## 2018-05-30 LAB — BASIC METABOLIC PANEL
BUN: 14 mg/dL (ref 6–23)
CALCIUM: 9.5 mg/dL (ref 8.4–10.5)
CO2: 27 meq/L (ref 19–32)
CREATININE: 0.71 mg/dL (ref 0.40–1.50)
Chloride: 103 mEq/L (ref 96–112)
GFR: 124.71 mL/min (ref >60.00)
GLUCOSE: 94 mg/dL (ref 70–99)
Potassium: 4.3 mEq/L (ref 3.5–5.1)
Sodium: 139 mEq/L (ref 135–145)

## 2018-05-30 LAB — LIPID PANEL
Cholesterol: 113 mg/dL (ref 0–200)
HDL: 37 mg/dL — AB (ref >39.00)
LDL CALC: 65 mg/dL (ref 0–99)
NONHDL: 75.66
Total CHOL/HDL Ratio: 3
Triglycerides: 53 mg/dL (ref 0.0–149.0)
VLDL: 10.6 mg/dL (ref 0.0–40.0)

## 2018-05-30 LAB — TSH: TSH: 0.68 u[IU]/mL (ref 0.35–4.50)

## 2018-05-30 NOTE — Progress Notes (Signed)
   Subjective:    Patient ID: Christopher Fox, male    DOB: 05-01-1980, 38 y.o.   MRN: [id number]  HPI Here for a well exam. He feels fine. He has been on a diet and has lost 45 lbs in the past 6 months.    Review of Systems  Constitutional: Negative.   HENT: Negative.   Eyes: Negative.   Respiratory: Negative.   Cardiovascular: Negative.   Gastrointestinal: Negative.   Genitourinary: Negative.   Musculoskeletal: Negative.   Skin: Negative.   Neurological: Negative.   Psychiatric/Behavioral: Negative.        Objective:   Physical Exam Constitutional:      General: He is not in acute distress.    Appearance: He is well-developed. He is not diaphoretic.  HENT:     Head: Normocephalic and atraumatic.     Right Ear: External ear normal.     Left Ear: External ear normal.     Nose: Nose normal.     Mouth/Throat:     Pharynx: No oropharyngeal exudate.  Eyes:     General: No scleral icterus.       Right eye: No discharge.        Left eye: No discharge.     Conjunctiva/sclera: Conjunctivae normal.     Pupils: Pupils are equal, round, and reactive to light.  Neck:     Musculoskeletal: Neck supple.     Thyroid: No thyromegaly.     Vascular: No JVD.     Trachea: No tracheal deviation.  Cardiovascular:     Rate and Rhythm: Normal rate and regular rhythm.     Heart sounds: Normal heart sounds. No murmur. No friction rub. No gallop.   Pulmonary:     Effort: Pulmonary effort is normal. No respiratory distress.     Breath sounds: Normal breath sounds. No wheezing or rales.  Chest:     Chest wall: No tenderness.  Abdominal:     General: Bowel sounds are normal. There is no distension.     Palpations: Abdomen is soft. There is no mass.     Tenderness: There is no abdominal tenderness. There is no guarding or rebound.  Genitourinary:    Penis: Normal. No tenderness.      Prostate: Normal.     Rectum: Normal. Guaiac result negative.  Musculoskeletal: Normal range of motion.         General: No tenderness.  Lymphadenopathy:     Cervical: No cervical adenopathy.  Skin:    General: Skin is warm and dry.     Coloration: Skin is not pale.     Findings: No erythema or rash.  Neurological:     Mental Status: He is alert and oriented to person, place, and time.     Cranial Nerves: No cranial nerve deficit.     Motor: No abnormal muscle tone.     Coordination: Coordination normal.     Deep Tendon Reflexes: Reflexes are normal and symmetric. Reflexes normal.  Psychiatric:        Behavior: Behavior normal.        Thought Content: Thought content normal.        Judgment: Judgment normal.           Assessment & Plan:  Well exam. We discussed diet and exercise. Get fasting labs.  Gershon Crane, MD

## 2018-07-18 ENCOUNTER — Other Ambulatory Visit: Payer: Self-pay

## 2018-07-18 ENCOUNTER — Encounter: Payer: Self-pay | Admitting: Family Medicine

## 2018-07-18 ENCOUNTER — Ambulatory Visit (INDEPENDENT_AMBULATORY_CARE_PROVIDER_SITE_OTHER): Payer: 59 | Admitting: Family Medicine

## 2018-07-18 ENCOUNTER — Telehealth: Payer: Self-pay | Admitting: *Deleted

## 2018-07-18 DIAGNOSIS — J019 Acute sinusitis, unspecified: Secondary | ICD-10-CM | POA: Diagnosis not present

## 2018-07-18 MED ORDER — AMOXICILLIN-POT CLAVULANATE 875-125 MG PO TABS: 1.0000 | ORAL_TABLET | Freq: Two times a day (BID) | ORAL | 0 refills | Status: DC

## 2018-07-18 NOTE — Telephone Encounter (Signed)
Patient complaining of a sore throat x 3 days.  Patient states that Saturday he had a sore throat and fever 100.0.  Patient did not have fever Sunday but the sore throat continues, "feels like strep throat".  Patient did try an allergie medication with no relief.  Would you like a virtual visit?  [phone]

## 2018-07-18 NOTE — Telephone Encounter (Signed)
Please set up a virtual visit 

## 2018-07-18 NOTE — Progress Notes (Signed)
   Subjective:    Patient ID: Christopher Fox, male    DOB: 17-Jul-1980, 38 y.o.   MRN: [id number]  HPI Virtual Visit via Video Note  I connected with the patient on 07/18/18 at 11:30 AM EDT by a video enabled telemedicine application and verified that I am speaking with the correct person using two identifiers.  Location patient: home Location provider:work or home office Persons participating in the virtual visit: patient, provider  I discussed the limitations of evaluation and management by telemedicine and the availability of in person appointments. The patient expressed understanding and agreed to proceed.   HPI: Here for 3 days of sinus pressure, PND, ST, and fever to 100 degrees. No cough or chest pain or SOB. Using Ibuprofen and Zyrtec. Drinking fluids.    ROS: See pertinent positives and negatives per HPI.  Past Medical History:  Diagnosis Date  . Allergy   . GERD (gastroesophageal reflux disease)     Past Surgical History:  Procedure Laterality Date  . CHOLECYSTECTOMY N/A 03/27/2015   Procedure: LAPAROSCOPIC CHOLECYSTECTOMY ;  Surgeon: Avel Peace, MD;  Location: [facility];  Service: General;  Laterality: N/A;  . KNEE SURGERY     right   . TONSILLECTOMY    . TYMPANOSTOMY TUBE PLACEMENT      Family History  Problem Relation Age of Onset  . Hypertension Unknown   . Sudden death Unknown   . Stroke Unknown   . Asthma Unknown      Current Outpatient Medications:  .  amoxicillin-clavulanate (AUGMENTIN) 875-125 MG tablet, Take 1 tablet by mouth 2 (two) times daily., Disp: 20 tablet, Rfl: 0  EXAM:  VITALS per patient if applicable:  GENERAL: alert, oriented, appears well and in no acute distress  HEENT: atraumatic, conjunttiva clear, no obvious abnormalities on inspection of external nose and ears  NECK: normal movements of the head and neck  LUNGS: on inspection no signs of respiratory distress, breathing rate appears normal, no obvious gross SOB, gasping or  wheezing  CV: no obvious cyanosis  MS: moves all visible extremities without noticeable abnormality  PSYCH/NEURO: pleasant and cooperative, no obvious depression or anxiety, speech and thought processing grossly intact  ASSESSMENT AND PLAN: Sinusitis, treat with Augmentin.  Gershon Crane, MD  Discussed the following assessment and plan:  No diagnosis found.     I discussed the assessment and treatment plan with the patient. The patient was provided an opportunity to ask questions and all were answered. The patient agreed with the plan and demonstrated an understanding of the instructions.   The patient was advised to call back or seek an in-person evaluation if the symptoms worsen or if the condition fails to improve as anticipated.     Review of Systems     Objective:   Physical Exam        Assessment & Plan:

## 2018-07-18 NOTE — Telephone Encounter (Signed)
Virtual visit scheduled.  

## 2019-02-03 ENCOUNTER — Telehealth (INDEPENDENT_AMBULATORY_CARE_PROVIDER_SITE_OTHER): Payer: 59 | Admitting: Adult Health

## 2019-02-03 ENCOUNTER — Other Ambulatory Visit: Payer: Self-pay

## 2019-02-03 DIAGNOSIS — J019 Acute sinusitis, unspecified: Secondary | ICD-10-CM

## 2019-02-03 MED ORDER — AMOXICILLIN-POT CLAVULANATE 875-125 MG PO TABS: 1.0000 | ORAL_TABLET | Freq: Two times a day (BID) | ORAL | 0 refills | Status: DC

## 2019-02-03 MED ORDER — OMEPRAZOLE 20 MG PO CPDR: 20.0000 mg | DELAYED_RELEASE_CAPSULE | Freq: Every day | ORAL | 0 refills | Status: DC

## 2019-02-03 NOTE — Progress Notes (Signed)
Virtual Visit via Telephone Note  I connected with Christopher Fox on 02/03/19 at  3:30 PM EST by telephone and verified that I am speaking with the correct person using two identifiers.   I discussed the limitations, risks, security and privacy concerns of performing an evaluation and management service by telephone and the availability of in person appointments. I also discussed with the patient that there may be a patient responsible charge related to this service. The patient expressed understanding and agreed to proceed.  Location patient: home Location provider: work or home office Participants present for the call: patient, provider Patient did not have a visit in the prior 7 days to address this/these issue(s).   History of Present Illness: 38 year old male who  has a past medical history of Allergy and GERD (gastroesophageal reflux disease).  Being evaluated today for an acute issue.  He has been experiencing 2 to 3 days of sinus pain and pressure, nasal congestion, fever up to 99.9, fatigue, and a semiproductive cough.  He denies nausea, vomiting, diarrhea, shortness of breath.  Been using Motrin which helps with his fever.  Sinus infection back in May and reports that the symptoms are very similar.   Observations/Objective: Patient sounds cheerful and well on the phone. I do not appreciate any SOB. Speech and thought processing are grossly intact. Patient reported vitals:  Assessment and Plan: 1. Acute sinusitis, recurrence not specified, unspecified location - amoxicillin-clavulanate (AUGMENTIN) 875-125 MG tablet; Take 1 tablet by mouth 2 (two) times daily.  Dispense: 20 tablet; Refill: 0 -Follow-up if no improvement in the next 2 to 3 days.  Follow Up Instructions:  I did not refer this patient for an OV in the next 24 hours for this/these issue(s).  I discussed the assessment and treatment plan with the patient. The patient was provided an opportunity to ask questions  and all were answered. The patient agreed with the plan and demonstrated an understanding of the instructions.   The patient was advised to call back or seek an in-person evaluation if the symptoms worsen or if the condition fails to improve as anticipated.  I provided 15  minutes of non-face-to-face time during this encounter.   Dorothyann Peng, NP

## 2019-02-14 ENCOUNTER — Telehealth: Payer: Self-pay | Admitting: *Deleted

## 2019-02-14 NOTE — Telephone Encounter (Signed)
Patient was tested for COVID- he had + result last Tuesday 11/24.( not tested at [facility] facility) Patient was having symptoms: congestion, loss of smell. Patient wants to know how long to isolate. Patient was treated for sinus infection 11/20- did he have COVID then? Hard to say- possible- but loss of smell is a definitive COVID symptom and that started Monday- he can count that as symptom if he is unsure. He can start 10 symptom count from there if he wishes. That gives him 3 more days and he is complete. Health dept number given for work purposes- he has not been contacted from them yet. Will let PCP know about + COVID testing and that he is improving. Patient instructed to call back for worsening symptoms.

## 2019-02-17 NOTE — Telephone Encounter (Signed)
Patient is calling Tillie Rung back. Was advised that the office is closed at this time.

## 2019-02-17 NOTE — Telephone Encounter (Signed)
Called pt back no answer will call Monday.

## 2019-02-17 NOTE — Telephone Encounter (Signed)
Left message to return phone call.

## 2019-02-25 ENCOUNTER — Other Ambulatory Visit: Payer: Self-pay | Admitting: Adult Health

## 2019-02-28 NOTE — Telephone Encounter (Signed)
Closing note 

## 2019-02-28 NOTE — Telephone Encounter (Signed)
Left message to return phone call.

## 2019-03-02 NOTE — Telephone Encounter (Signed)
Pt calling back. Please advise.

## 2019-03-03 NOTE — Telephone Encounter (Signed)
Called patient to follow-up, patient stated that he is doing well. Nothing further needed.

## 2019-03-28 ENCOUNTER — Other Ambulatory Visit: Payer: Self-pay | Admitting: Family Medicine

## 2019-03-31 ENCOUNTER — Ambulatory Visit: Payer: Self-pay | Admitting: *Deleted

## 2019-03-31 NOTE — Telephone Encounter (Signed)
Spoke with Dr. Clent Ridges he stated that the patient should be fine to be seen in office since it has been over a month since the positive test.

## 2019-03-31 NOTE — Telephone Encounter (Signed)
Pt called stating he had COVID 01/2019; since then he has been having SOB, and feels like something in his chest; he says the symptoms are worse when he lays down; he was tested at [facility] in [place], [place]; recommendations made per nurse triage protocol;he verbalized understanding; the pt sees Dr Clent Ridges, Georgena Spurling; pt transferred to [facility] for scheduling.  Reason for Disposition . [1] Longstanding difficulty breathing AND [2] not responding to usual therapy  Answer Assessment - Initial Assessment Questions 1. RESPIRATORY STATUS: "Describe your breathing?" (e.g., wheezing, shortness of breath, unable to speak, severe coughing)    " Feels like something in chest" 2. ONSET: "When did this breathing problem begin?"      02/10/2019 3. PATTERN "Does the difficult breathing come and go, or has it been constant since it started?"      intermittent 4. SEVERITY: "How bad is your breathing?" (e.g., mild, moderate, severe)    - MILD: No SOB at rest, mild SOB with walking, speaks normally in sentences, can lay down, no retractions, pulse < 100.    - MODERATE: SOB at rest, SOB with minimal exertion and prefers to sit, cannot lie down flat, speaks in phrases, mild retractions, audible wheezing, pulse 100-120.    - SEVERE: Very SOB at rest, speaks in single words, struggling to breathe, sitting hunched forward, retractions, pulse > 120      mild 5. RECURRENT SYMPTOM: "Have you had difficulty breathing before?" If so, ask: "When was the last time?" and "What happened that time?"      no 6. CARDIAC HISTORY: "Do you have any history of heart disease?" (e.g., heart attack, angina, bypass surgery, angioplasty)     no 7. LUNG HISTORY: "Do you have any history of lung disease?"  (e.g., pulmonary embolus, asthma, emphysema)   +COVID 02/10/2019 8. CAUSE: "What do you think is causing the breathing problem?"    Due to COVID 9. OTHER SYMPTOMS: "Do you have any other symptoms? (e.g., dizziness, runny  nose, cough, chest pain, fever)    Pt is having a lot of gas 10. PREGNANCY: "Is there any chance you are pregnant?" "When was your last menstrual period?"       n/a 11. TRAVEL: "Have you traveled out of the country in the last month?" (e.g., travel history, exposures)       no  Protocols used: BREATHING DIFFICULTY-A-AH

## 2019-03-31 NOTE — Telephone Encounter (Signed)
Spoke with patient and he stated that he wanted to come into the office to see Dr. Clent Ridges, a virtual visit was offered, pt. declined. Patient wanted to see if Dr. Clent Ridges would be able to see him in office on Monday at 4 pm. Patient advised that message will be sent to Dr. Clent Ridges for approval and that office would call him back about appointment.Please advise.

## 2019-04-04 NOTE — Telephone Encounter (Signed)
Left message for patient to call back. CRM created 

## 2019-04-11 NOTE — Telephone Encounter (Signed)
Left message for patient to call back  

## 2019-04-12 ENCOUNTER — Ambulatory Visit: Payer: 59 | Admitting: Family Medicine

## 2019-04-12 ENCOUNTER — Ambulatory Visit (INDEPENDENT_AMBULATORY_CARE_PROVIDER_SITE_OTHER): Payer: 59

## 2019-04-12 ENCOUNTER — Encounter: Payer: Self-pay | Admitting: Family Medicine

## 2019-04-12 ENCOUNTER — Other Ambulatory Visit: Payer: Self-pay

## 2019-04-12 VITALS — BP 130/80 | HR 88 | Temp 97.8°F | Wt 283.2 lb

## 2019-04-12 DIAGNOSIS — R0602 Shortness of breath: Secondary | ICD-10-CM | POA: Diagnosis not present

## 2019-04-12 DIAGNOSIS — U071 COVID-19: Secondary | ICD-10-CM | POA: Diagnosis not present

## 2019-04-12 NOTE — Progress Notes (Signed)
   Subjective:    Patient ID: Christopher Fox, male    DOB: 1981/03/07, 39 y.o.   MRN: [id number]  HPI Here to check on his breathing. He says "it may be from anxiety, I don't know". On 02-09-19 he tested positive for the Covid-19 virus, after he had had a few days of fever, stuffy sinuses, and a dry cough. These symptoms resolved quickly andhe felt fine for a few weeks. Then several weeks ago he began to feel a slight SOB when he was sitting still or lying in bed. No chest pain or cough or fever. He never feels this during the day. In fact he has a physical job, and he has no trouble doing this for hours at a time. He never has to stop to rest or to catch his breath. He admits that he was very frightened to have the virus, and he worries that it may come back to make him sick again. Of note he had been seeing a Pulmonary doctor at [facility] for possible sleep apnea, and Christopher Fox is scheduled for a sleep study this weekend.    Review of Systems  Constitutional: Negative.   Respiratory: Positive for shortness of breath. Negative for cough, choking, chest tightness, wheezing and stridor.   Cardiovascular: Negative.        Objective:   Physical Exam Constitutional:      Appearance: Normal appearance. He is well-developed. He is not ill-appearing.  Cardiovascular:     Rate and Rhythm: Normal rate and regular rhythm.     Pulses: Normal pulses.     Heart sounds: Normal heart sounds.  Pulmonary:     Effort: Pulmonary effort is normal. No respiratory distress.     Breath sounds: Normal breath sounds. No stridor. No wheezing, rhonchi or rales.  Chest:     Chest wall: No tenderness.  Neurological:     Mental Status: He is alert.           Assessment & Plan:  SOB at rest but not on exertion. I think this is a manifestation of anxiety, and we spoke of the ways stress can affect the body. Just to be complete he will get a chest Xray today. Follow up prn.  Gershon Crane, MD

## 2019-05-02 ENCOUNTER — Other Ambulatory Visit: Payer: Self-pay | Admitting: Family Medicine

## 2019-05-19 ENCOUNTER — Other Ambulatory Visit: Payer: Self-pay | Admitting: Family Medicine

## 2020-05-13 ENCOUNTER — Encounter: Payer: 59 | Admitting: Family Medicine

## 2020-06-14 ENCOUNTER — Encounter: Payer: Self-pay | Admitting: Family Medicine

## 2020-09-04 ENCOUNTER — Encounter: Payer: Self-pay | Admitting: Family Medicine

## 2020-09-13 ENCOUNTER — Encounter: Payer: Self-pay | Admitting: Family Medicine

## 2020-12-30 IMAGING — DX DG CHEST 2V
2 series · 2 of 2 positions shown · non-contrast
Comparison: None.

CLINICAL DATA: Shortness of breath. Recent [id number] virus
infection.

EXAM:
CHEST - 2 VIEW

[chest pa]
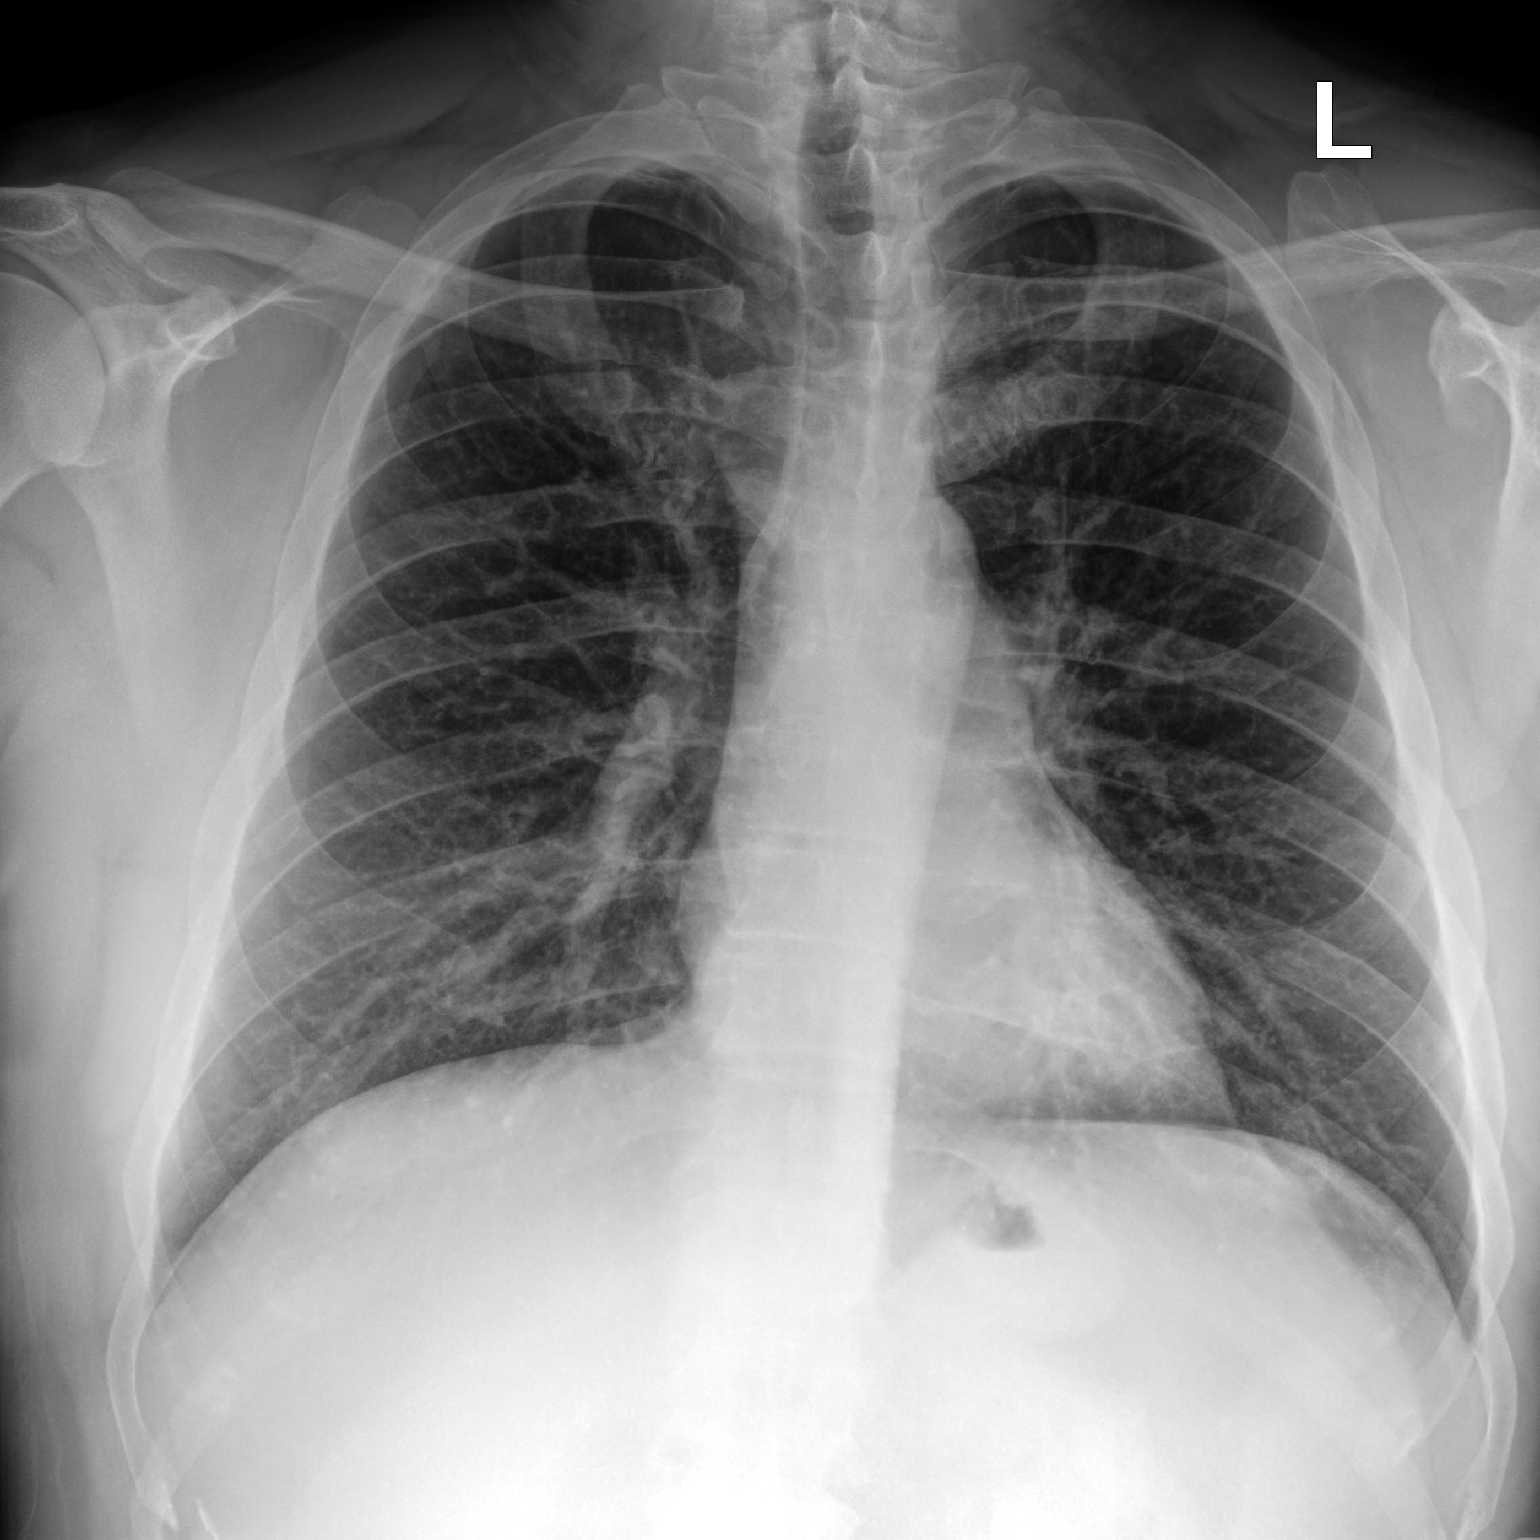

[chest lat]
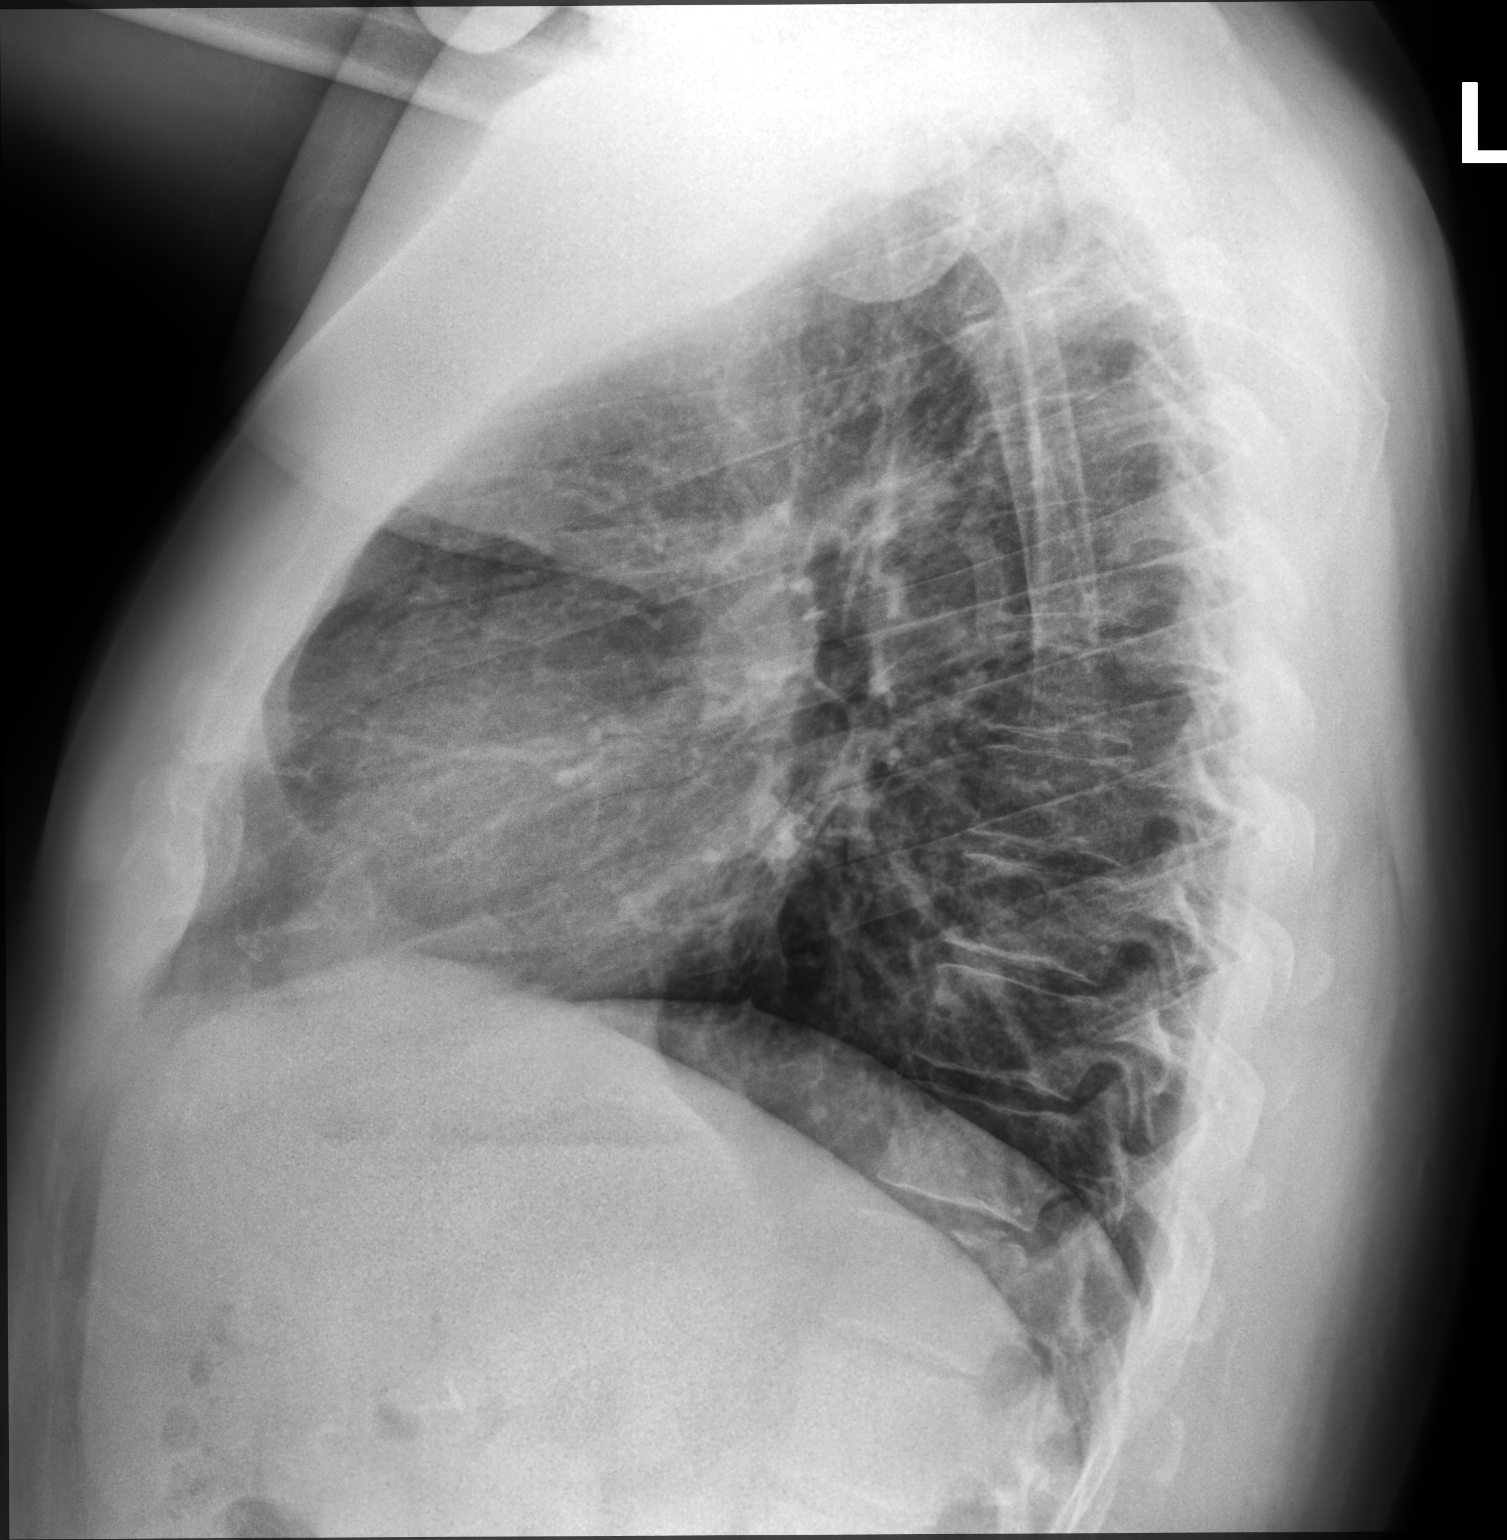

[2 of 2 positions shown; findings below may reference images not displayed]

FINDINGS: The heart size and mediastinal contours are within normal limits.
Both lungs are clear. The visualized skeletal structures are
unremarkable.
IMPRESSION: Normal study.
